# Patient Record
Sex: Female | Born: 1975 | Race: Black or African American | Hispanic: No | Marital: Married | Smoking: Never smoker
Health system: Southern US, Community
[De-identification: ages and names within clinical notes are randomized; demographics above are authoritative.]

## PROBLEM LIST (undated history)

## (undated) DIAGNOSIS — D649 Anemia, unspecified: Secondary | ICD-10-CM

## (undated) DIAGNOSIS — R002 Palpitations: Secondary | ICD-10-CM

## (undated) DIAGNOSIS — I341 Nonrheumatic mitral (valve) prolapse: Secondary | ICD-10-CM

---

## 2019-02-12 DIAGNOSIS — I341 Nonrheumatic mitral (valve) prolapse: Secondary | ICD-10-CM | POA: Insufficient documentation

## 2019-09-18 HISTORY — PX: LAPAROSCOPIC HYSTERECTOMY: SHX1926

## 2021-01-10 ENCOUNTER — Other Ambulatory Visit: Payer: Self-pay | Admitting: Internal Medicine

## 2021-01-10 DIAGNOSIS — E041 Nontoxic single thyroid nodule: Secondary | ICD-10-CM

## 2021-01-12 ENCOUNTER — Other Ambulatory Visit: Payer: Self-pay | Admitting: Internal Medicine

## 2021-01-12 DIAGNOSIS — Z1231 Encounter for screening mammogram for malignant neoplasm of breast: Secondary | ICD-10-CM

## 2021-02-08 ENCOUNTER — Ambulatory Visit
Admission: RE | Admit: 2021-02-08 | Discharge: 2021-02-08 | Disposition: A | Discharge status: Home / Self Care | Payer: Managed Care, Other (non HMO) | Source: Ambulatory Visit | Attending: Internal Medicine | Admitting: Internal Medicine

## 2021-02-08 ENCOUNTER — Other Ambulatory Visit: Payer: Self-pay

## 2021-02-08 DIAGNOSIS — Z1231 Encounter for screening mammogram for malignant neoplasm of breast: Secondary | ICD-10-CM

## 2021-02-08 DIAGNOSIS — E041 Nontoxic single thyroid nodule: Secondary | ICD-10-CM

## 2021-08-21 ENCOUNTER — Emergency Department (HOSPITAL_BASED_OUTPATIENT_CLINIC_OR_DEPARTMENT_OTHER): Payer: Managed Care, Other (non HMO)

## 2021-08-21 ENCOUNTER — Emergency Department (HOSPITAL_BASED_OUTPATIENT_CLINIC_OR_DEPARTMENT_OTHER)
Admission: EM | Admit: 2021-08-21 | Discharge: 2021-08-21 | Disposition: A | Discharge status: Home / Self Care | Payer: Managed Care, Other (non HMO) | Attending: Emergency Medicine | Admitting: Emergency Medicine

## 2021-08-21 ENCOUNTER — Other Ambulatory Visit: Payer: Self-pay

## 2021-08-21 ENCOUNTER — Emergency Department (HOSPITAL_BASED_OUTPATIENT_CLINIC_OR_DEPARTMENT_OTHER): Payer: Managed Care, Other (non HMO) | Admitting: Radiology

## 2021-08-21 ENCOUNTER — Encounter (HOSPITAL_BASED_OUTPATIENT_CLINIC_OR_DEPARTMENT_OTHER): Payer: Self-pay | Admitting: Emergency Medicine

## 2021-08-21 DIAGNOSIS — R079 Chest pain, unspecified: Secondary | ICD-10-CM

## 2021-08-21 DIAGNOSIS — R0602 Shortness of breath: Secondary | ICD-10-CM | POA: Diagnosis not present

## 2021-08-21 DIAGNOSIS — R072 Precordial pain: Secondary | ICD-10-CM | POA: Insufficient documentation

## 2021-08-21 HISTORY — DX: Palpitations: R00.2

## 2021-08-21 HISTORY — DX: Anemia, unspecified: D64.9

## 2021-08-21 HISTORY — DX: Nonrheumatic mitral (valve) prolapse: I34.1

## 2021-08-21 LAB — CBC
HCT: 39.2 % (ref 36.0–46.0)
Hemoglobin: 12.9 g/dL (ref 12.0–15.0)
MCH: 31.4 pg (ref 26.0–34.0)
MCHC: 32.9 g/dL (ref 30.0–36.0)
MCV: 95.4 fL (ref 80.0–100.0)
Platelets: 207 10*3/uL (ref 150–400)
RBC: 4.11 MIL/uL (ref 3.87–5.11)
RDW: 13.2 % (ref 11.5–15.5)
WBC: 4.6 10*3/uL (ref 4.0–10.5)
nRBC: 0 % (ref 0.0–0.2)

## 2021-08-21 LAB — BASIC METABOLIC PANEL
Anion gap: 8 (ref 5–15)
BUN: 8 mg/dL (ref 6–20)
CO2: 25 mmol/L (ref 22–32)
Calcium: 9 mg/dL (ref 8.9–10.3)
Chloride: 105 mmol/L (ref 98–111)
Creatinine, Ser: 0.7 mg/dL (ref 0.44–1.00)
GFR, Estimated: >60 mL/min (ref >60)
Glucose, Bld: 118 mg/dL — ABNORMAL HIGH (ref 70–99)
Potassium: 3.6 mmol/L (ref 3.5–5.1)
Sodium: 138 mmol/L (ref 135–145)

## 2021-08-21 LAB — TROPONIN I (HIGH SENSITIVITY)
Troponin I (High Sensitivity): <2 ng/L (ref <18)
Troponin I (High Sensitivity): 2 ng/L (ref <18)

## 2021-08-21 MED ORDER — FAMOTIDINE IN NACL 20-0.9 MG/50ML-% IV SOLN
20.0000 mg | Freq: Once | INTRAVENOUS | Status: AC
  Administered 2021-08-21: 20 mg via INTRAVENOUS
  Filled 2021-08-21: qty 50

## 2021-08-21 MED ORDER — ASPIRIN 81 MG PO CHEW
324.0000 mg | CHEWABLE_TABLET | Freq: Once | ORAL | Status: AC
  Administered 2021-08-21: 324 mg via ORAL
  Filled 2021-08-21: qty 4

## 2021-08-21 MED ORDER — KETOROLAC TROMETHAMINE 15 MG/ML IJ SOLN
15.0000 mg | Freq: Once | INTRAMUSCULAR | Status: AC
  Administered 2021-08-21: 15 mg via INTRAVENOUS
  Filled 2021-08-21: qty 1

## 2021-08-21 MED ORDER — ALUM & MAG HYDROXIDE-SIMETH 200-200-20 MG/5ML PO SUSP
30.0000 mL | Freq: Once | ORAL | Status: AC
  Administered 2021-08-21: 30 mL via ORAL
  Filled 2021-08-21: qty 30

## 2021-08-21 MED ORDER — IOHEXOL 350 MG/ML SOLN
75.0000 mL | Freq: Once | INTRAVENOUS | Status: AC | PRN
  Administered 2021-08-21: 75 mL via INTRAVENOUS

## 2021-08-21 MED ORDER — LIDOCAINE VISCOUS HCL 2 % MT SOLN
15.0000 mL | Freq: Once | OROMUCOSAL | Status: AC
  Administered 2021-08-21: 15 mL via ORAL
  Filled 2021-08-21: qty 15

## 2021-08-21 NOTE — ED Provider Notes (Signed)
[facility] EMERGENCY DEPT Provider Note   CSN: [id number] Arrival date & time: 08/21/21  1205     History Chief Complaint  Patient presents with   Chest Pain    Haley Welch is a 45 y.o. female.   Chest Pain Pain location:  Substernal area Pain quality: pressure   Pain radiates to:  Does not radiate Pain severity:  Moderate Onset quality:  Gradual Duration:  2 weeks Timing:  Intermittent Progression:  Worsening Chronicity:  New Context: breathing   Relieved by:  Nothing Worsened by:  Deep breathing Ineffective treatments:  None tried Associated symptoms: shortness of breath   Associated symptoms: no abdominal pain, no back pain, no cough, no fatigue, no fever, no headache, no nausea, no near-syncope, no palpitations, no vomiting and no weakness   Risk factors comment:  Hx of MVP Patient presents for chest pain.  This started 2 weeks ago but worsened today.  She also endorses shortness of breath. HPI: A 45 year old patient presents for evaluation of chest pain. Initial onset of pain was less than one hour ago. The patient's chest pain is described as heaviness/pressure/tightness and is not worse with exertion. The patient's chest pain is middle- or left-sided, is not well-localized, is not sharp and does not radiate to the arms/jaw/neck. The patient does not complain of nausea and denies diaphoresis. The patient has a family history of coronary artery disease in a first-degree relative with onset less than age 8. The patient has no history of stroke, has no history of peripheral artery disease, has not smoked in the past 90 days, denies any history of treated diabetes, is not hypertensive, has no history of hypercholesterolemia and does not have an elevated BMI (>=30).   Past Medical History:  Diagnosis Date   Anemia    Mitral valve prolapse    Palpitations     There are no problems to display for this patient.   History reviewed. No pertinent  surgical history.   OB History   No obstetric history on file.     History reviewed. No pertinent family history.  Social History   Tobacco Use   Smoking status: Never   Smokeless tobacco: Never    Home Medications Prior to Admission medications   Not on File    Allergies    Patient has no known allergies.  Review of Systems   Review of Systems  Constitutional:  Negative for chills, fatigue and fever.  HENT:  Negative for ear pain and sore throat.   Eyes:  Negative for pain and visual disturbance.  Respiratory:  Positive for chest tightness and shortness of breath. Negative for cough and wheezing.   Cardiovascular:  Positive for chest pain. Negative for palpitations and near-syncope.  Gastrointestinal:  Negative for abdominal pain, diarrhea, nausea and vomiting.  Genitourinary:  Negative for dysuria, flank pain, hematuria and pelvic pain.  Musculoskeletal:  Negative for arthralgias and back pain.  Skin:  Negative for color change and rash.  Neurological:  Negative for seizures, syncope, weakness and headaches.  All other systems reviewed and are negative.  Physical Exam Updated Vital Signs BP 128/80 (BP Location: Right Arm)   Pulse 72   Temp 98.7 F (37.1 C) (Oral)   Resp 15   Ht 5' 7.5" (1.715 m)   Wt 72.6 kg   SpO2 100%   BMI 24.69 kg/m   Physical Exam Vitals and nursing note reviewed.  Constitutional:      General: She is not in acute  distress.    Appearance: She is well-developed and normal weight. She is not ill-appearing, toxic-appearing or diaphoretic.  HENT:     Head: Normocephalic and atraumatic.  Eyes:     Extraocular Movements: Extraocular movements intact.     Conjunctiva/sclera: Conjunctivae normal.  Neck:     Vascular: No JVD.  Cardiovascular:     Rate and Rhythm: Normal rate and regular rhythm.     Heart sounds: No murmur heard. Pulmonary:     Effort: Pulmonary effort is normal. No respiratory distress.     Breath sounds: Normal  breath sounds. No decreased breath sounds, wheezing, rhonchi or rales.  Chest:     Chest wall: Tenderness present.  Abdominal:     Palpations: Abdomen is soft.     Tenderness: There is no abdominal tenderness.  Musculoskeletal:        General: No swelling.     Cervical back: Normal range of motion and neck supple.     Right lower leg: No edema.     Left lower leg: No edema.  Skin:    General: Skin is warm and dry.  Neurological:     General: No focal deficit present.     Mental Status: She is alert and oriented to person, place, and time.     Cranial Nerves: No cranial nerve deficit.     Motor: No weakness.  Psychiatric:        Mood and Affect: Mood normal.        Behavior: Behavior normal.    ED Results / Procedures / Treatments   Labs (all labs ordered are listed, but only abnormal results are displayed) Labs Reviewed  BASIC METABOLIC PANEL - Abnormal; Notable for the following components:      Result Value   Glucose, Bld 118 (*)    All other components within normal limits  CBC  TROPONIN I (HIGH SENSITIVITY)  TROPONIN I (HIGH SENSITIVITY)    EKG EKG Interpretation  Date/Time:  Monday August 21 2021 12:21:53 EST Ventricular Rate:  79 PR Interval:  126 QRS Duration: 84 QT Interval:  392 QTC Calculation: 449 R Axis:   66 Text Interpretation: Normal sinus rhythm Nonspecific T wave abnormality Abnormal ECG Confirmed by Godfrey Pick ([id number]) on 08/21/2021 1:15:09 PM  Radiology DG Chest 2 View  Result Date: 08/21/2021 CLINICAL DATA:  Chest pain EXAM: CHEST - 2 VIEW COMPARISON:  None. FINDINGS: Cardiomediastinal silhouette is normal. The lungs are clear, with no focal consolidation or pulmonary edema. There he is no pleural effusion or pneumothorax. There is no acute osseous abnormality. IMPRESSION: No radiographic evidence of acute cardiopulmonary process. Electronically Signed   By: Valetta Mole M.D.   On: 08/21/2021 12:49   CT Angio Chest PE W and/or Wo  Contrast  Result Date: 08/21/2021 CLINICAL DATA:  PE suspected, high prob EXAM: CT ANGIOGRAPHY CHEST WITH CONTRAST TECHNIQUE: Multidetector CT imaging of the chest was performed using the standard protocol during bolus administration of intravenous contrast. Multiplanar CT image reconstructions and MIPs were obtained to evaluate the vascular anatomy. CONTRAST:  22mL OMNIPAQUE IOHEXOL 350 MG/ML SOLN COMPARISON:  None. FINDINGS: Cardiovascular: Satisfactory opacification of the pulmonary arteries to the segmental level. No evidence of pulmonary embolism. Normal heart size. No pericardial effusion. Mediastinum/Nodes: No enlarged nodes. Thyroid and esophagus are unremarkable. Lungs/Pleura: No consolidation or mass. No pleural effusion or pneumothorax. Upper Abdomen: No acute abnormality. Musculoskeletal: No acute osseous abnormality. Review of the MIP images confirms the above findings. IMPRESSION: No evidence of  acute pulmonary embolism or other acute abnormality. Electronically Signed   By: Macy Mis M.D.   On: 08/21/2021 15:02    Procedures Procedures   Medications Ordered in ED Medications  aspirin chewable tablet 324 mg (324 mg Oral Given 08/21/21 1420)  iohexol (OMNIPAQUE) 350 MG/ML injection 75 mL (75 mLs Intravenous Contrast Given 08/21/21 1439)  alum & mag hydroxide-simeth (MAALOX/MYLANTA) 200-200-20 MG/5ML suspension 30 mL (30 mLs Oral Given 08/21/21 1555)    And  lidocaine (XYLOCAINE) 2 % viscous mouth solution 15 mL (15 mLs Oral Given 08/21/21 1556)  famotidine (PEPCID) IVPB 20 mg premix (20 mg Intravenous New Bag/Given 08/21/21 1558)  ketorolac (TORADOL) 15 MG/ML injection 15 mg (15 mg Intravenous Given 08/21/21 1554)    ED Course  I have reviewed the triage vital signs and the nursing notes.  Pertinent labs & imaging results that were available during my care of the patient were reviewed by me and considered in my medical decision making (see chart for details).    MDM  Rules/Calculators/A&P HEAR Score: 4                        Patient presents for chest pressure.  This has been present over the past 2 weeks.  She feels that it got worse today.  Currently, she describes it as 5/10 in severity.  EKG shows nonspecific T wave changes, without evidence of ischemia.  Heart score is 4.  Patient was given 324 of ASA.  Lab work, including troponins was ordered.  Patient underwent a CTA of her chest.  Work-up was reassuring.  GI cocktail and Toradol were given for symptomatic relief.  She has a history of MVP and has seen cardiology in the past.  She has been lost to follow-up for the past several years.  She was advised to reestablish care with cardiology and to follow-up for reassessment and possible further outpatient testing.  She is also encouraged to return to ED for any worsening of symptoms.  She was discharged in stable condition.  Final Clinical Impression(s) / ED Diagnoses Final diagnoses:  Chest pain, unspecified type    Rx / DC Orders ED Discharge Orders     None        Godfrey Pick, MD 08/22/21 [phone]

## 2021-08-21 NOTE — ED Notes (Signed)
Patient transported to CT 

## 2021-08-21 NOTE — ED Triage Notes (Signed)
Pt arrives to ED with c/o centralized chest pain. This started two weeks ago, however worsened today. Pt reports that her chest is tender to the touch. The pain is intermittent until it became constant today. The pain is described as soreness/pressure. Associated symptoms include unable to take deep breaths, pt feels lungs are "restricted." Pt denies lightheadedness, N/V.

## 2021-10-15 ENCOUNTER — Other Ambulatory Visit: Payer: Self-pay

## 2021-10-15 ENCOUNTER — Emergency Department (HOSPITAL_BASED_OUTPATIENT_CLINIC_OR_DEPARTMENT_OTHER): Payer: Managed Care, Other (non HMO)

## 2021-10-15 ENCOUNTER — Emergency Department (HOSPITAL_BASED_OUTPATIENT_CLINIC_OR_DEPARTMENT_OTHER)
Admission: EM | Admit: 2021-10-15 | Discharge: 2021-10-15 | Disposition: A | Discharge status: Home / Self Care | Payer: Managed Care, Other (non HMO) | Attending: Emergency Medicine | Admitting: Emergency Medicine

## 2021-10-15 DIAGNOSIS — Y9301 Activity, walking, marching and hiking: Secondary | ICD-10-CM | POA: Insufficient documentation

## 2021-10-15 DIAGNOSIS — Z79899 Other long term (current) drug therapy: Secondary | ICD-10-CM | POA: Diagnosis not present

## 2021-10-15 DIAGNOSIS — S96911A Strain of unspecified muscle and tendon at ankle and foot level, right foot, initial encounter: Secondary | ICD-10-CM | POA: Diagnosis not present

## 2021-10-15 DIAGNOSIS — W010XXA Fall on same level from slipping, tripping and stumbling without subsequent striking against object, initial encounter: Secondary | ICD-10-CM | POA: Diagnosis not present

## 2021-10-15 DIAGNOSIS — S99911A Unspecified injury of right ankle, initial encounter: Secondary | ICD-10-CM | POA: Diagnosis present

## 2021-10-15 MED ORDER — IBUPROFEN 800 MG PO TABS
800.0000 mg | ORAL_TABLET | Freq: Once | ORAL | Status: AC
  Administered 2021-10-15: 800 mg via ORAL
  Filled 2021-10-15: qty 1

## 2021-10-15 NOTE — ED Notes (Signed)
Pt verbalizes understanding of discharge instructions. Opportunity for questioning and answers were provided. Pt discharged from ED to home.   ? ?

## 2021-10-15 NOTE — ED Triage Notes (Signed)
Pt was walking in heels this afternoon when she slipped and fell and twisted right ankle. Unable to apply weight on right foot but was able to drive self

## 2021-10-15 NOTE — Discharge Instructions (Signed)
Your x-ray today was negative for acute fracture.  You likely sustained a sprain of that ankle.  It given you a boot and some crutches to help with pain while you walk.  I have attached some information here about RICE therapy which is essentially to rest, elevate the leg on a few pillows when you are sitting or lying down.  Please ice for 15 to 20 minutes at a time over the next 24 to 48 hours.  You can use Tylenol and Motrin as needed for pain.  Your symptoms should resolve over the next couple of weeks.  Follow-up with your PCP as needed.

## 2021-10-15 NOTE — ED Provider Notes (Signed)
[facility] EMERGENCY DEPT Provider Note   CSN: [id number] Arrival date & time: 10/15/21  1722     History  Chief Complaint  Patient presents with   Ankle Pain    Haley Welch is a 46 y.o. female who presents to the ED for evaluation of a right ankle injury that occurred earlier today.  Patient was at a restaurant and walking on a cobblestone street with high heels when she rolled her ankle, inverting it and falling down.  She is able to get up and walk order received and did not notice much pain at first.  Over time, she developed swelling, pain and was having trouble walking.  No treatment prior to arrival.  Pain is aggravated when pressing on the proximal lateral aspect of the foot where it meets the ankle.  Patient has no other complaints.  She denies numbness and tingling.   Ankle Pain Associated symptoms: no fever       Home Medications Prior to Admission medications   Not on File      Allergies    Patient has no known allergies.    Review of Systems   Review of Systems  Constitutional:  Negative for fever.  HENT: Negative.    Eyes: Negative.   Respiratory:  Negative for shortness of breath.   Cardiovascular: Negative.   Gastrointestinal:  Negative for abdominal pain and vomiting.  Endocrine: Negative.   Genitourinary: Negative.   Musculoskeletal:  Positive for joint swelling.  Skin:  Negative for rash.  Neurological:  Negative for headaches.  All other systems reviewed and are negative.  Physical Exam Updated Vital Signs BP (!) 132/91 (BP Location: Left Arm)    Pulse 83    Temp 98.3 F (36.8 C) (Oral)    Resp 18    Ht 5\' 7"  (1.702 m)    Wt 72.6 kg    SpO2 100%    BMI 25.06 kg/m  Physical Exam Vitals and nursing note reviewed.  Constitutional:      General: She is not in acute distress.    Appearance: She is not ill-appearing.  HENT:     Head: Atraumatic.  Eyes:     Conjunctiva/sclera: Conjunctivae normal.  Cardiovascular:      Rate and Rhythm: Normal rate and regular rhythm.     Pulses: Normal pulses.     Heart sounds: No murmur heard. Pulmonary:     Effort: Pulmonary effort is normal. No respiratory distress.     Breath sounds: Normal breath sounds.  Abdominal:     General: Abdomen is flat. There is no distension.     Palpations: Abdomen is soft.     Tenderness: There is no abdominal tenderness.  Musculoskeletal:        General: Normal range of motion.     Cervical back: Normal range of motion.     Comments: Right ankle with mild swelling.  Tenderness to palpation at the ATL.  2+ DP pulses bilaterally.  Sensation intact.  Patient ambulatory on arrival.  Skin:    General: Skin is warm and dry.     Capillary Refill: Capillary refill takes less than 2 seconds.  Neurological:     General: No focal deficit present.     Mental Status: She is alert.  Psychiatric:        Mood and Affect: Mood normal.    ED Results / Procedures / Treatments   Labs (all labs ordered are listed, but only abnormal results are displayed) Labs  Reviewed - No data to display  EKG None  Radiology DG Ankle Complete Right  Result Date: 10/15/2021 CLINICAL DATA:  Twisted ankle. EXAM: RIGHT ANKLE - COMPLETE 3+ VIEW COMPARISON:  None. FINDINGS: There is no evidence of fracture, dislocation, or joint effusion. There is no evidence of arthropathy or other focal bone abnormality. Soft tissues are unremarkable. IMPRESSION: Negative. Electronically Signed   By: Ronney Asters M.D.   On: 10/15/2021 18:33    Procedures Procedures    Medications Ordered in ED Medications  ibuprofen (ADVIL) tablet 800 mg (800 mg Oral Given 10/15/21 1849)    ED Course/ Medical Decision Making/ A&P                           Medical Decision Making Amount and/or Complexity of Data Reviewed Radiology: ordered.  Risk Prescription drug management.   History:  Haley Welch is a 46 y.o. female who presents to the ED for evaluation of a right ankle  injury that occurred earlier today.  Patient was at a restaurant and walking on a cobblestone street with high heels when she rolled her ankle, inverting it and falling down.  She is able to get up and walk order received and did not notice much pain at first.  Over time, she developed swelling, pain and was having trouble walking.  No treatment prior to arrival.  Pain is aggravated when pressing on the proximal lateral aspect of the foot where it meets the ankle.  Patient has no other complaints.  She denies numbness and tingling. This patient presents to the ED for concern of ankle injury, this involves an extensive number of treatment options, and is a complaint that carries with it a high risk of complications and morbidity.   Differentials include fracture, sprain, tendon rupture  Initial impression:  Patient resting comfortably on bed, no acute distress nontoxic-appearing.  Physical exam was reassuring, patient is ambulatory although with difficulty due to pain.  Swelling is mild.  This is likely a strain, however we will wait for ankle x-ray prior to discharge.   Imaging Studies ordered:  I ordered imaging studies including right ankle x-ray which was negative for acute traumatic injury I independently visualized and interpreted imaging and I agree with the radiologist interpretation.   Medicines ordered and prescription drug management:  I ordered medication including: Ibuprofen 800 mg for pain   Reevaluation of the patient after these medicines showed that the patient improved I have reviewed the patients home medicines and have made adjustments as needed   Disposition:  After consideration of the diagnostic results, physical exam, history and the patients response to treatment feel that the patent would benefit from discharge with outpatient follow-up.   Right ankle sprain: X-ray and exam reassuring.  Cam boot and crutches were provided at patient request.  This is reasonable given  her pain during ambulation.  She had improvement with ibuprofen.  RICE protocol indicated.  She is to follow-up with her PCP as needed.  All questions asked and answered.  Discharged home in good condition.   Final Clinical Impression(s) / ED Diagnoses Final diagnoses:  Strain of right ankle, initial encounter    Rx / DC Orders ED Discharge Orders     None         Tonye Pearson, PA-C 10/15/21 1903    Fredia Sorrow, MD 10/15/21 2330

## 2022-01-04 ENCOUNTER — Ambulatory Visit (INDEPENDENT_AMBULATORY_CARE_PROVIDER_SITE_OTHER): Payer: Managed Care, Other (non HMO) | Admitting: Nurse Practitioner

## 2022-01-04 ENCOUNTER — Encounter (HOSPITAL_BASED_OUTPATIENT_CLINIC_OR_DEPARTMENT_OTHER): Payer: Self-pay | Admitting: Nurse Practitioner

## 2022-01-04 VITALS — BP 123/89 | HR 89 | Temp 98.7°F | Ht 67.0 in | Wt 159.0 lb

## 2022-01-04 DIAGNOSIS — D229 Melanocytic nevi, unspecified: Secondary | ICD-10-CM

## 2022-01-04 DIAGNOSIS — Z1211 Encounter for screening for malignant neoplasm of colon: Secondary | ICD-10-CM | POA: Diagnosis not present

## 2022-01-04 DIAGNOSIS — Z1231 Encounter for screening mammogram for malignant neoplasm of breast: Secondary | ICD-10-CM | POA: Diagnosis not present

## 2022-01-04 DIAGNOSIS — L910 Hypertrophic scar: Secondary | ICD-10-CM

## 2022-01-04 DIAGNOSIS — E042 Nontoxic multinodular goiter: Secondary | ICD-10-CM

## 2022-01-04 DIAGNOSIS — H9313 Tinnitus, bilateral: Secondary | ICD-10-CM

## 2022-01-04 DIAGNOSIS — E041 Nontoxic single thyroid nodule: Secondary | ICD-10-CM

## 2022-01-04 DIAGNOSIS — Z90710 Acquired absence of both cervix and uterus: Secondary | ICD-10-CM

## 2022-01-04 HISTORY — DX: Nontoxic single thyroid nodule: E04.1

## 2022-01-04 NOTE — Progress Notes (Signed)
? ?New Patient Office Visit ? ?Subjective   ? ?Patient ID: Haley Welch, female    DOB: 17-Jun-1976  Age: 46 y.o. MRN: [id number] ? ?CC:  ?Chief Complaint  ?Patient presents with  ? Establish Care  ? ? ?HPI ?Haley Welch presents to establish care ?She move to [place] from the [place] area about 2 years ago.  ?She endorses a history of hysterectomy 2 years ago due to heavy menses requiring iron infusions and blood transfusions. Her ovaries are intact, but her cervix was removed. She no longer requires pap screenings.  ? ?Tinnitus ?Chronic in nature. - etiology unknown ?Has seen ENT in the past and was referred to tinnitus clinic but didn't go due to move ?Primarily in the right ear, but now feels this is worsening ?Tinnitus clinic referral desired today for further evaluation.  ? ?Keloid and Nevi ?On umbilicus from her hysterectomy  ?Saw dermatology and they recommended leaving this  ?She feels it is growing and would like further evaluation and management ?Multiple nevi present on body that she would also like evaluated.  ? ?Thyroid Nodule ?Had an ultrasound, believes the results were inconclusive ?Once records reviewed would like to follow-up on this ? ?No outpatient encounter medications on file as of 01/04/2022.  ? ?No facility-administered encounter medications on file as of 01/04/2022.  ? ? ?Past Medical History:  ?Diagnosis Date  ? Anemia   ? Mitral valve prolapse   ? Palpitations   ? Thyroid nodule 01/04/2022  ? ? ?History reviewed. No pertinent surgical history. ? ?History reviewed. No pertinent family history. ? ?Social History  ? ?Socioeconomic History  ? Marital status: Married  ?  Spouse name: Not on file  ? Number of children: Not on file  ? Years of education: Not on file  ? Highest education level: Not on file  ?Occupational History  ? Not on file  ?Tobacco Use  ? Smoking status: Never  ? Smokeless tobacco: Never  ?Substance and Sexual Activity  ? Alcohol use: Not Currently  ? Drug use: Never  ? Sexual  activity: Yes  ?  Birth control/protection: None  ?Other Topics Concern  ? Not on file  ?Social History Narrative  ? Not on file  ? ?Social Determinants of Health  ? ?Financial Resource Strain: Not on file  ?Food Insecurity: Not on file  ?Transportation Needs: Not on file  ?Physical Activity: Not on file  ?Stress: Not on file  ?Social Connections: Not on file  ?Intimate Partner Violence: Not on file  ? ? ?ROS ?All review of systems negative except what is listed in the HPI ? ?Objective   ? ?BP 123/89   Pulse 89   Temp 98.7 ?F (37.1 ?C)   Ht '5\' 7"'$  (1.702 m)   Wt 159 lb (72.1 kg)   SpO2 99%   BMI 24.90 kg/m?  ? ?Physical Exam ?Vitals and nursing note reviewed.  ?Constitutional:   ?   Appearance: Normal appearance. She is normal weight.  ?HENT:  ?   Head: Normocephalic.  ?Eyes:  ?   Extraocular Movements: Extraocular movements intact.  ?   Conjunctiva/sclera: Conjunctivae normal.  ?   Pupils: Pupils are equal, round, and reactive to light.  ?Neck:  ?   Vascular: No carotid bruit.  ?Cardiovascular:  ?   Rate and Rhythm: Normal rate and regular rhythm.  ?   Pulses: Normal pulses.  ?   Heart sounds: Normal heart sounds.  ?Pulmonary:  ?   Effort: Pulmonary  effort is normal.  ?   Breath sounds: Normal breath sounds.  ?Abdominal:  ?   General: Abdomen is flat. Bowel sounds are normal.  ?   Palpations: Abdomen is soft.  ?Musculoskeletal:     ?   General: Normal range of motion.  ?   Cervical back: Normal range of motion.  ?   Right lower leg: No edema.  ?   Left lower leg: No edema.  ?Lymphadenopathy:  ?   Cervical: No cervical adenopathy.  ?Skin: ?   General: Skin is warm and dry.  ?   Capillary Refill: Capillary refill takes less than 2 seconds.  ? ?    ?Neurological:  ?   General: No focal deficit present.  ?   Mental Status: She is alert and oriented to person, place, and time.  ?Psychiatric:     ?   Mood and Affect: Mood normal.     ?   Behavior: Behavior normal.     ?   Thought Content: Thought content normal.      ?   Judgment: Judgment normal.  ? ? ? ?Assessment & Plan:  ? ?Problem List Items Addressed This Visit   ? ? Thyroid nodule  ?  Chronic. Previous evaluation performed. No records to review at this time. No nodularity detected on exam today. Will plan to repeat thyroid US for further evaluation.  ? ?  ?  ? Relevant Orders  ? US THYROID  ? Keloid skin disorder  ?  Appx 3cm keloid to umbilicus following hysterectomy surgery 2 years ago. Patient reports this is growing in size. Recommend evaluation with dermatology for recommendations for treatment. No alarm sx present at this time, but given size, I do feel that management is needed.  ? ?  ?  ? Relevant Orders  ? Ambulatory referral to Dermatology  ? Multiple nevi  ?  Referral to dermatology today.  ? ?  ?  ? Relevant Orders  ? Ambulatory referral to Dermatology  ? Tinnitus of both ears  ?  Chronic. Worsening with R>L. Will send referral today to ENT for further evaluation and recommendations on monitoring and options for control/management.  ? ?  ?  ? Relevant Orders  ? Ambulatory referral to ENT  ? History of hysterectomy including cervix  ?  Pap screening no longer required. Will removed from HM list.  ? ?  ?  ? ?Other Visit Diagnoses   ? ? Screening mammogram for breast cancer    -  Primary  ? Relevant Orders  ? MM Digital Screening  ? Screening for colon cancer      ? Relevant Orders  ? Ambulatory referral to Gastroenterology  ? ?  ? ? ?Return for in future for CPE as schedule permits.  ? ?Orma Render, NP ? ? ?

## 2022-01-04 NOTE — Patient Instructions (Addendum)
Thank you for choosing [place] [facility] for your Primary Care needs. I am excited for the opportunity to partner with you to meet your health care goals. It was a pleasure meeting you today! ? ?Recommendations from today's visit: ?I have placed a referral for colonoscopy for you today ?I have placed a referral to dermatology for you ?I will look up the tinnitus clinic to send a referral for you to be seen about the ear ringing.  ?I have placed an order for the mammogram, they will call you to schedule in May ?I have placed the order for the repeat ultrasound of the thyroid- we will plan to have this done in May ? ?Information on diet, exercise, and health maintenance recommendations are listed below. This is information to help you be sure you are on track for optimal health and monitoring.  ? ?Please look over this and let Haley Welch know if you have any questions or if you have completed any of the health maintenance outside of [place] so that we can be sure your records are up to date.  ?___________________________________________________________ ?About Me: ?I am an Adult-Geriatric Nurse Practitioner with a background in caring for patients for more than 20 years with a strong intensive care background. I provide primary care and sports medicine services to patients age 79 and older within this office. My education had a strong focus on caring for the older adult population, which I am passionate about. I am also the director of the APP Fellowship with [facility].  ? ?My desire is to provide you with the best service through preventive medicine and supportive care. I consider you a part of the medical team and value your input. I work diligently to ensure that you are heard and your needs are met in a safe and effective manner. I want you to feel comfortable with me as your provider and want you to know that your health concerns are important to me. ? ?For your information, our office  hours are: ?Monday, Tuesday, and Thursday 8:00 AM - 5:00 PM ?Wednesday and Friday 8:00 AM - 12:00 PM.  ? ?In my time away from the office I am teaching new APP's within the system and am unavailable, but my partner, Dr. Burnard Bunting is in the office for emergent needs.  ? ?If you have questions or concerns, please call our office at [phone] or send Haley Welch a MyChart message and we will respond as quickly as possible.  ?____________________________________________________________ ?MyChart:  ?For all urgent or time sensitive needs we ask that you please call the office to avoid delays. Our number is [phone]. ?MyChart is not constantly monitored and due to the large volume of messages a day, replies may take up to 72 business hours. ? ?MyChart Policy: ?MyChart allows for you to see your visit notes, after visit summary, provider recommendations, lab and tests results, make an appointment, request refills, and contact your provider or the office for non-urgent questions or concerns. Providers are seeing patients during normal business hours and do not have built in time to review MyChart messages.  ?We ask that you allow a minimum of 3 business days for responses to [employer]. For this reason, please do not send urgent requests through [place]. Please call the office at [phone]. ?New and ongoing conditions may require a visit. We have virtual and in person visit available for your convenience.  ?Complex MyChart concerns may require a visit. Your provider may request you schedule  a virtual or in person visit to ensure we are providing the best care possible. ?MyChart messages sent after 11:00 AM on Friday will not be received by the provider until Monday morning.  ?  ?Lab and Test Results: ?You will receive your lab and test results on MyChart as soon as they are completed and results have been sent by the lab or testing facility. Due to this service, you will receive your results BEFORE your provider.  ?I  review lab and tests results each morning prior to seeing patients. Some results require collaboration with other providers to ensure you are receiving the most appropriate care. For this reason, we ask that you please allow a minimum of 3-5 business days from the time the ALL results have been received for your provider to receive and review lab and test results and contact you about these.  ?Most lab and test result comments from the provider will be sent through [place]. Your provider may recommend changes to the plan of care, follow-up visits, repeat testing, ask questions, or request an office visit to discuss these results. You may reply directly to this message or call the office at [phone] to provide information for the provider or set up an appointment. ?In some instances, you will be called with test results and recommendations. Please let Haley Welch know if this is preferred and we will make note of this in your chart to provide this for you.    ?If you have not heard a response to your lab or test results in 5 business days from all results returning to [place], please call the office to let Haley Welch know. We ask that you please avoid calling prior to this time unless there is an emergent concern. Due to high call volumes, this can delay the resulting process. ? ?After Hours: ?For all non-emergency after hours needs, please call the office at [phone] and select the option to reach the on-call provider service. On-call services are shared between multiple [place] offices and therefore it will not be possible to speak directly with your provider. On-call providers may provide medical advice and recommendations, but are unable to provide refills for maintenance medications.  ?For all emergency or urgent medical needs after normal business hours, we recommend that you seek care at the closest Urgent Care or Emergency Department to ensure appropriate treatment in a timely manner.  ?[facility] at  [place] has a 24 hour emergency room located on the ground floor for your convenience.  ? ?Urgent Concerns During the Business Day ?Providers are seeing patients from 8AM to [place] with a busy schedule and are most often not able to respond to non-urgent calls until the end of the day or the next business day. ?If you should have URGENT concerns during the day, please call and speak to the nurse or schedule a same day appointment so that we can address your concern without delay.  ? ?Thank you, again, for choosing me as your health care partner. I appreciate your trust and look forward to learning more about you.  ? ?Worthy Keeler, DNP, AGNP-c ?___________________________________________________________ ? ?Health Maintenance Recommendations ?Screening Testing ?Mammogram ?Every 1 -2 years based on history and risk factors ?Starting at age 44 ?Pap Smear ?Ages 21-39 every 3 years ?Ages 52-65 every 5 years with HPV testing ?More frequent testing may be required based on results and history ?Colon Cancer Screening ?Every 1-10 years based on test performed, risk factors, and history ?Starting at age 59 ?Bone Density  Screening ?Every 2-10 years based on history ?Starting at age 27 for women ?Recommendations for men differ based on medication usage, history, and risk factors ?AAA Screening ?One time ultrasound ?Men 96-59 years old who have every smoked ?Lung Cancer Screening ?Low Dose Lung CT every 12 months ?Age 41-80 years with a 30 pack-year smoking history who still smoke or who have quit within the last 15 years ? ?Screening Labs ?Routine  Labs: Complete Blood Count (CBC), Complete Metabolic Panel (CMP), Cholesterol (Lipid Panel) ?Every 6-12 months based on history and medications ?May be recommended more frequently based on current conditions or previous results ?Hemoglobin A1c Lab ?Every 3-12 months based on history and previous results ?Starting at age 32 or earlier with diagnosis of diabetes, high cholesterol, BMI  >26, and/or risk factors ?Frequent monitoring for patients with diabetes to ensure blood sugar control ?Thyroid Panel (TSH w/ T3 & T4) ?Every 6 months based on history, symptoms, and risk factors ?May be

## 2022-01-15 ENCOUNTER — Ambulatory Visit (HOSPITAL_BASED_OUTPATIENT_CLINIC_OR_DEPARTMENT_OTHER)
Admission: RE | Admit: 2022-01-15 | Discharge: 2022-01-15 | Disposition: A | Discharge status: Home / Self Care | Payer: Managed Care, Other (non HMO) | Source: Ambulatory Visit | Attending: Nurse Practitioner | Admitting: Nurse Practitioner

## 2022-01-15 DIAGNOSIS — E041 Nontoxic single thyroid nodule: Secondary | ICD-10-CM | POA: Diagnosis not present

## 2022-01-16 DIAGNOSIS — L910 Hypertrophic scar: Secondary | ICD-10-CM | POA: Insufficient documentation

## 2022-01-16 DIAGNOSIS — H9313 Tinnitus, bilateral: Secondary | ICD-10-CM | POA: Insufficient documentation

## 2022-01-16 DIAGNOSIS — Z90710 Acquired absence of both cervix and uterus: Secondary | ICD-10-CM | POA: Insufficient documentation

## 2022-01-16 DIAGNOSIS — D229 Melanocytic nevi, unspecified: Secondary | ICD-10-CM | POA: Insufficient documentation

## 2022-01-16 NOTE — Assessment & Plan Note (Signed)
Pap screening no longer required. Will removed from [facility] list.  ?

## 2022-01-16 NOTE — Assessment & Plan Note (Signed)
Appx 3cm keloid to umbilicus following hysterectomy surgery 2 years ago. Patient reports this is growing in size. Recommend evaluation with dermatology for recommendations for treatment. No alarm sx present at this time, but given size, I do feel that management is needed.  ?

## 2022-01-16 NOTE — Assessment & Plan Note (Signed)
Chronic. Worsening with R>L. Will send referral today to ENT for further evaluation and recommendations on monitoring and options for control/management.  ?

## 2022-01-16 NOTE — Assessment & Plan Note (Signed)
Referral to dermatology today.  ?

## 2022-01-16 NOTE — Assessment & Plan Note (Signed)
Chronic. Previous evaluation performed. No records to review at this time. No nodularity detected on exam today. Will plan to repeat thyroid US for further evaluation.  ?

## 2022-01-18 ENCOUNTER — Encounter (HOSPITAL_BASED_OUTPATIENT_CLINIC_OR_DEPARTMENT_OTHER): Payer: Self-pay | Admitting: Nurse Practitioner

## 2022-01-18 NOTE — Addendum Note (Signed)
Addended by: Antoinetta Berrones, Clarise Cruz E on: 01/18/2022 08:51 PM ? ? Modules accepted: Orders ? ?

## 2022-02-16 ENCOUNTER — Ambulatory Visit
Admission: RE | Admit: 2022-02-16 | Discharge: 2022-02-16 | Disposition: A | Discharge status: Home / Self Care | Payer: Managed Care, Other (non HMO) | Source: Ambulatory Visit | Attending: Nurse Practitioner | Admitting: Nurse Practitioner

## 2022-02-16 DIAGNOSIS — Z1231 Encounter for screening mammogram for malignant neoplasm of breast: Secondary | ICD-10-CM

## 2022-03-02 ENCOUNTER — Encounter (HOSPITAL_BASED_OUTPATIENT_CLINIC_OR_DEPARTMENT_OTHER): Payer: 59 | Admitting: Nurse Practitioner

## 2022-06-04 ENCOUNTER — Encounter (HOSPITAL_BASED_OUTPATIENT_CLINIC_OR_DEPARTMENT_OTHER): Payer: Self-pay | Admitting: Nurse Practitioner

## 2022-06-04 ENCOUNTER — Telehealth (INDEPENDENT_AMBULATORY_CARE_PROVIDER_SITE_OTHER): Payer: Managed Care, Other (non HMO) | Admitting: Nurse Practitioner

## 2022-06-04 DIAGNOSIS — U071 COVID-19: Secondary | ICD-10-CM

## 2022-06-04 DIAGNOSIS — E041 Nontoxic single thyroid nodule: Secondary | ICD-10-CM

## 2022-06-04 DIAGNOSIS — D229 Melanocytic nevi, unspecified: Secondary | ICD-10-CM

## 2022-06-04 DIAGNOSIS — Z1211 Encounter for screening for malignant neoplasm of colon: Secondary | ICD-10-CM

## 2022-06-04 DIAGNOSIS — L910 Hypertrophic scar: Secondary | ICD-10-CM | POA: Diagnosis not present

## 2022-06-04 MED ORDER — NIRMATRELVIR/RITONAVIR (PAXLOVID)TABLET: 3.0000 | ORAL_TABLET | Freq: Two times a day (BID) | ORAL | 0 refills | Status: AC

## 2022-06-04 NOTE — Progress Notes (Signed)
Virtual Visit Encounter telephone visit.   I connected with  Haley Welch on 06/16/22 at 10:50 AM EDT by secure audio telemedicine application. I verified that I am speaking with the correct person using two identifiers.   I introduced myself as a Designer, jewellery with the practice. The limitations of evaluation and management by telemedicine discussed with the patient and the availability of in person appointments. The patient expressed verbal understanding and consent to proceed.  Participating parties in this visit include: Myself and patient  The patient is: Patient Location: Home I am: Provider Location: Office/Clinic Subjective:    CC and HPI: Haley Welch is a 46 y.o. year old female presenting for new evaluation and treatment of COVID. Patient reports the following: COVID  Orabelle endorses symptoms starting last Thursday.  At that time she tested and she was negative.  She continued to have worsening symptoms and she tested again Sunday and was possitive She endorses congestion, fever, severe body aches, chills, fatigue. She denies shortness of breath, dizziness, or respiratory difficulty.  She would also like a referral to dermatology for keloids and a referral to gastroenterology for colon cancer screening.  Past medical history, Surgical history, Family history not pertinant except as noted below, Social history, Allergies, and medications have been entered into the medical record, reviewed, and corrections made.   Review of Systems:  All review of systems negative except what is listed in the HPI  Objective:    Alert and oriented x 4 Speaking in clear sentences with no shortness of breath. No distress.  Impression and Recommendations:    Problem List Items Addressed This Visit     Keloid skin disorder    Referral placed to dermatology.      Relevant Orders   Ambulatory referral to Dermatology   COVID-19 - Primary    Positive COVID-19 test with  symptoms.  Recommendations provided for over-the-counter treatment and monitoring for worsening symptoms and alarm symptoms that would warrant immediate evaluation.  Patient expresses understanding.  We will send treatment Paxlovid.  Follow-up if symptoms worsen or fail to improve.      Multiple nevi   Other Visit Diagnoses     Screening for colon cancer       Relevant Orders   Ambulatory referral to Gastroenterology       orders and follow up as documented in EMR I discussed the assessment and treatment plan with the patient. The patient was provided an opportunity to ask questions and all were answered. The patient agreed with the plan and demonstrated an understanding of the instructions.   The patient was advised to call back or seek an in-person evaluation if the symptoms worsen or if the condition fails to improve as anticipated.  Follow-Up: prn  I provided 15 minutes of non-face-to-face interaction with this non face-to-face encounter including intake, same-day documentation, and chart review.   Orma Render, NP , DNP, AGNP-c [place] at [facility] [phone] (fax)

## 2022-06-04 NOTE — Patient Instructions (Addendum)
I have sent in the Paxlovid to the pharmacy.      Over the counter medications that may be helpful for symptoms: Guaifenesin 1200 mg extended release tabs twice daily, with plenty of water For cough and congestion Brand name: Mucinex   Pseudoephedrine 30 mg, one or two tabs every 4 to 6 hours (one will be less drying) For sinus congestion Brand name: Sudafed You must get this from the pharmacy counter.  Oxymetazoline nasal spray each morning, one spray in each nostril, for NO MORE THAN 3 days  For nasal and sinus congestion Brand name: Afrin Saline nasal spray or Saline Nasal Irrigation 3-5 times a day For nasal and sinus congestion Brand names: Ocean or AYR Fluticasone nasal spray, one spray in each nostril, each morning after oxymetazoline and saline, if used For nasal and sinus congestion Brand name: Flonase Warm salt water gargles  For sore throat Every few hours as needed Alternate ibuprofen 400-600 mg and acetaminophen 1000 mg every 4-6 hours For fever, body aches, headache Brand names: Motrin or Advil and Tylenol Dextromethorphan 12-hour cough version 30 mg every 12 hours  For cough Brand name: Delsym Stop all other cold medications for now (Nyquil, Dayquil, Tylenol Cold, Theraflu, etc) and other non-prescription cough/cold preparations. Many of these have the same ingredients listed above and could cause an overdose of medication.   General Instructions Allow your body to rest Drink PLENTY of fluids Isolate yourself from everyone, even family, until test results have returned  If your COVID-19 test is positive Then you ARE INFECTED and you can pass the virus to others You must quarantine from others for a minimum of  10 days since symptoms started AND You are fever free for 24 hours WITHOUT any medication to reduce fever AND Your symptoms are improving Do not go to the store or other public areas Do not go around household members who are not known to be infected  with COVID-19 If you MUST leave you area of quarantine (example: go to a bathroom you share with others in your home), you must Wear a mask Wash your hands thoroughly Wipe down any surfaces you touch Do not share food, drinks, towels, or other items with other persons Dispose of your own tissues, food containers, etc  Once you have recovered, please continue good preventive care measures, including:  wearing a mask when in public wash your hands frequently avoid touching your face/nose/eyes cover coughs/sneezes with the inside of your elbow stay out of crowds keep a 6 foot distance from others  Go to the nearest hospital emergency room if fever/cough/breathlessness are severe or illness seems like a threat to life.

## 2022-06-05 ENCOUNTER — Encounter (HOSPITAL_BASED_OUTPATIENT_CLINIC_OR_DEPARTMENT_OTHER): Payer: Self-pay

## 2022-06-16 DIAGNOSIS — U071 COVID-19: Secondary | ICD-10-CM | POA: Insufficient documentation

## 2022-06-16 HISTORY — DX: COVID-19: U07.1

## 2022-06-16 NOTE — Assessment & Plan Note (Signed)
Referral placed to dermatology

## 2022-06-16 NOTE — Assessment & Plan Note (Signed)
Positive COVID-19 test with symptoms.  Recommendations provided for over-the-counter treatment and monitoring for worsening symptoms and alarm symptoms that would warrant immediate evaluation.  Patient expresses understanding.  We will send treatment Paxlovid.  Follow-up if symptoms worsen or fail to improve.

## 2022-10-31 ENCOUNTER — Encounter: Payer: Self-pay | Admitting: Gastroenterology

## 2022-12-07 ENCOUNTER — Other Ambulatory Visit: Payer: Self-pay

## 2022-12-07 ENCOUNTER — Ambulatory Visit (AMBULATORY_SURGERY_CENTER): Payer: Managed Care, Other (non HMO)

## 2022-12-07 VITALS — Ht 68.5 in | Wt 155.0 lb

## 2022-12-07 DIAGNOSIS — Z1211 Encounter for screening for malignant neoplasm of colon: Secondary | ICD-10-CM

## 2022-12-07 MED ORDER — NA SULFATE-K SULFATE-MG SULF 17.5-3.13-1.6 GM/177ML PO SOLN: 1.0000 | Freq: Once | ORAL | 0 refills | Status: AC

## 2022-12-07 NOTE — Progress Notes (Signed)
Denies allergies to eggs or soy products. Denies complication of anesthesia or sedation. Denies use of weight loss medication. Denies use of O2.   Emmi instructions given for colonoscopy.  

## 2022-12-13 ENCOUNTER — Encounter: Payer: Self-pay | Admitting: Gastroenterology

## 2023-01-02 ENCOUNTER — Telehealth: Payer: Self-pay | Admitting: Gastroenterology

## 2023-01-02 ENCOUNTER — Telehealth: Payer: Self-pay | Admitting: *Deleted

## 2023-01-02 MED ORDER — NA SULFATE-K SULFATE-MG SULF 17.5-3.13-1.6 GM/177ML PO SOLN: 1.0000 | Freq: Once | ORAL | 0 refills | Status: AC

## 2023-01-02 NOTE — Telephone Encounter (Signed)
Patient is calling states her pharmacy has not received her prep medication. Please advise 

## 2023-01-02 NOTE — Telephone Encounter (Signed)
LM with pt informing them RX was sent to the [facility] on [address]. Gave contact # for call back if had any questions.

## 2023-01-02 NOTE — Telephone Encounter (Signed)
Attempted to reach pt to inform her of medication being sent to [facility] on [address]. LM with this information and gave call back # if had any questions.

## 2023-01-02 NOTE — Telephone Encounter (Signed)
Left message for patient to contact pharmacy and request that they fill the RX again- as the patient had her PV appt on 03/22 and apparently did not pick the RX in a timely manner;  Also advised patient to call back to the office at [phone] should questions/concerns arise/if pharmacy unable to fill RX that is needed tomorrow for her prep to begin;

## 2023-01-04 ENCOUNTER — Encounter: Payer: Self-pay | Admitting: Gastroenterology

## 2023-01-04 ENCOUNTER — Ambulatory Visit (AMBULATORY_SURGERY_CENTER): Payer: Managed Care, Other (non HMO) | Admitting: Gastroenterology

## 2023-01-04 VITALS — BP 128/87 | HR 72 | Temp 98.4°F | Resp 16 | Ht 68.0 in | Wt 155.0 lb

## 2023-01-04 DIAGNOSIS — Z1211 Encounter for screening for malignant neoplasm of colon: Secondary | ICD-10-CM

## 2023-01-04 MED ORDER — SODIUM CHLORIDE 0.9 % IV SOLN
500.0000 mL | INTRAVENOUS | Status: DC
Start: 2023-01-04 — End: 2023-01-04

## 2023-01-04 NOTE — Progress Notes (Signed)
[facility] History and Physical   Primary Care Physician:  Early, Sung Amabile, NP   Reason for Procedure:    CRC screening  Plan:    colon     HPI: Haley Welch is a 47 y.o. female    Past Medical History:  Diagnosis Date   Anemia    Mitral valve prolapse    Palpitations    Thyroid nodule 01/04/2022    Past Surgical History:  Procedure Laterality Date   LAPAROSCOPIC HYSTERECTOMY  2021    Prior to Admission medications   Medication Sig Start Date End Date Taking? Authorizing Provider  ibuprofen (ADVIL) 200 MG tablet Take 200 mg by mouth every 6 (six) hours as needed. Patient not taking: Reported on 01/04/2023    [provider]    Current Outpatient Medications  Medication Sig Dispense Refill   ibuprofen (ADVIL) 200 MG tablet Take 200 mg by mouth every 6 (six) hours as needed. (Patient not taking: Reported on 01/04/2023)     Current Facility-Administered Medications  Medication Dose Route Frequency Provider Last Rate Last Admin   0.9 %  sodium chloride infusion  500 mL Intravenous Continuous Lynann Bologna, MD        Allergies as of 01/04/2023   (No Known Allergies)    Family History  Problem Relation Age of Onset   Colon cancer Neg Hx    Esophageal cancer Neg Hx    Rectal cancer Neg Hx    Stomach cancer Neg Hx     Social History   Socioeconomic History   Marital status: Married    Spouse name: Not on file   Number of children: Not on file   Years of education: Not on file   Highest education level: Not on file  Occupational History   Not on file  Tobacco Use   Smoking status: Never   Smokeless tobacco: Never  Substance and Sexual Activity   Alcohol use: Not Currently   Drug use: Never   Sexual activity: Yes    Birth control/protection: None  Other Topics Concern   Not on file  Social History Narrative   Not on file   Social Determinants of Health   Financial Resource Strain: Not on file  Food Insecurity: Not on file   Transportation Needs: Not on file  Physical Activity: Not on file  Stress: Not on file  Social Connections: Not on file  Intimate Partner Violence: Not on file    Review of Systems: Positive for none All other review of systems negative except as mentioned in the HPI.  Physical Exam: Vital signs in last 24 hours: @   General:   Alert,  Well-developed, well-nourished, pleasant and cooperative in NAD Lungs:  Clear throughout to auscultation.   Heart:  Regular rate and rhythm; no murmurs, clicks, rubs,  or gallops. Abdomen:  Soft, nontender and nondistended. Normal bowel sounds.   Neuro/Psych:  Alert and cooperative. Normal mood and affect. A and O x 3    No significant changes were identified.  The patient continues to be an appropriate candidate for the planned procedure and anesthesia.   Edman Circle, MD. [facility] 01/04/2023 10:37 AM@

## 2023-01-04 NOTE — Patient Instructions (Signed)
YOU HAD AN ENDOSCOPIC PROCEDURE TODAY AT THE [facility]:   Refer to the procedure report that was given to you for any specific questions about what was found during the examination.  If the procedure report does not answer your questions, please call your gastroenterologist to clarify.  If you requested that your care partner not be given the details of your procedure findings, then the procedure report has been included in a sealed envelope for you to review at your convenience later.  YOU SHOULD EXPECT: Some feelings of bloating in the abdomen. Passage of more gas than usual.  Walking can help get rid of the air that was put into your GI tract during the procedure and reduce the bloating. If you had a lower endoscopy (such as a colonoscopy or flexible sigmoidoscopy) you may notice spotting of blood in your stool or on the toilet paper. If you underwent a bowel prep for your procedure, you may not have a normal bowel movement for a few days.  Please Note:  You might notice some irritation and congestion in your nose or some drainage.  This is from the oxygen used during your procedure.  There is no need for concern and it should clear up in a day or so.  SYMPTOMS TO REPORT IMMEDIATELY:  Following lower endoscopy (colonoscopy or flexible sigmoidoscopy):  Excessive amounts of blood in the stool  Significant tenderness or worsening of abdominal pains  Swelling of the abdomen that is new, acute  Fever of 100F or higher  For urgent or emergent issues, a gastroenterologist can be reached at any hour by calling [phone]. Do not use MyChart messaging for urgent concerns.    DIET:  We do recommend a small meal at first, but then you may proceed to your regular diet.  Drink plenty of fluids but you should avoid alcoholic beverages for 24 hours.  ACTIVITY:  You should plan to take it easy for the rest of today and you should NOT DRIVE or use heavy machinery until tomorrow (because of  the sedation medicines used during the test).    FOLLOW UP: Our staff will call the number listed on your records the next business day following your procedure.  We will call around 7:15- 8:00 am to check on you and address any questions or concerns that you may have regarding the information given to you following your procedure. If we do not reach you, we will leave a message.     If any biopsies were taken you will be contacted by phone or by letter within the next 1-3 weeks.  Please call us at [phone] if you have not heard about the biopsies in 3 weeks.    SIGNATURES/CONFIDENTIALITY: You and/or your care partner have signed paperwork which will be entered into your electronic medical record.  These signatures attest to the fact that that the information above on your After Visit Summary has been reviewed and is understood.  Full responsibility of the confidentiality of this discharge information lies with you and/or your care-partner.  

## 2023-01-04 NOTE — Op Note (Signed)
[facility] Patient Name: Haley Welch Procedure Date: 01/04/2023 10:38 AM MRN: [id number] Endoscopist: Lynann Bologna , MD, [id number] Age: 47 Referring MD:  Date of Birth: 1975-10-27 Gender: Female Account #: [id number] Procedure:                Colonoscopy Indications:              Screening for colorectal malignant neoplasm Medicines:                Monitored Anesthesia Care Procedure:                Pre-Anesthesia Assessment:                           - Prior to the procedure, a History and Physical                            was performed, and patient medications and                            allergies were reviewed. The patient's tolerance of                            previous anesthesia was also reviewed. The risks                            and benefits of the procedure and the sedation                            options and risks were discussed with the patient.                            All questions were answered, and informed consent                            was obtained. Prior Anticoagulants: The patient has                            taken no anticoagulant or antiplatelet agents. ASA                            Grade Assessment: I - A normal, healthy patient.                            After reviewing the risks and benefits, the patient                            was deemed in satisfactory condition to undergo the                            procedure.                           After obtaining informed consent, the colonoscope  was passed under direct vision. Throughout the                            procedure, the patient's blood pressure, pulse, and                            oxygen saturations were monitored continuously. The                            Olympus CF-HQ190L SN [id number] was introduced through                            the anus and advanced to the 2 cm into the ileum.                            The colonoscopy was  performed without difficulty.                            The patient tolerated the procedure well. The                            quality of the bowel preparation was good. The                            terminal ileum, ileocecal valve, appendiceal                            orifice, and rectum were photographed. Scope In: 10:43:14 AM Scope Out: 11:02:28 AM Scope Withdrawal Time: 0 hours 14 minutes 13 seconds  Total Procedure Duration: 0 hours 19 minutes 14 seconds  Findings:                 The colon (entire examined portion) appeared normal.                           Non-bleeding internal hemorrhoids were found during                            retroflexion. The hemorrhoids were small and Grade                            I (internal hemorrhoids that do not prolapse).                           The terminal ileum appeared normal.                           The exam was otherwise without abnormality on                            direct and retroflexion views. The colon was                            somewhat tortuous. Complications:  No immediate complications. Estimated Blood Loss:     Estimated blood loss: none. Impression:               - Small internal hemorrhoids.                           - Otherwise normal colonoscopy to TI.                           - The GI Genius (intelligent endoscopy module),                            computer-aided polyp detection system powered by AI                            was utilized to detect colorectal polyps through                            enhanced visualization during colonoscopy. Recommendation:           - Patient has a contact number available for                            emergencies. The signs and symptoms of potential                            delayed complications were discussed with the                            patient. Return to normal activities tomorrow.                            Written discharge instructions were  provided to the                            patient.                           - Resume previous diet.                           - Continue present medications.                           - Repeat colonoscopy in 10 years for screening                            purposes. Earlier, if with any new problems or                            change in family history.                           - The findings and recommendations were discussed                            with  the patient's family. Lynann Bologna, MD 01/04/2023 11:06:54 AM This report has been signed electronically.

## 2023-01-04 NOTE — Progress Notes (Signed)
Pt's states no medical or surgical changes since previsit or office visit. 

## 2023-01-04 NOTE — Progress Notes (Signed)
Report given to PACU, vss 

## 2023-01-07 ENCOUNTER — Telehealth: Payer: Self-pay | Admitting: *Deleted

## 2023-01-07 NOTE — Telephone Encounter (Signed)
  Follow up Call-     01/04/2023   10:20 AM  Call back number  Post procedure Call Back phone  # [phone]  Permission to leave phone message Yes     Patient questions:  Do you have a fever, pain , or abdominal swelling? No. Pain Score  0 *  Have you tolerated food without any problems? Yes.    Have you been able to return to your normal activities? Yes.    Do you have any questions about your discharge instructions: Diet   No. Medications  No. Follow up visit  No.  Do you have questions or concerns about your Care? No.  Actions: * If pain score is 4 or above: No action needed, pain <4.

## 2023-01-17 IMAGING — MG MM DIGITAL SCREENING BILAT W/ TOMO AND CAD
8 series · 8 of 24 positions shown · non-contrast
Comparison: Previous exam(s).

CLINICAL DATA: Screening.

EXAM:
DIGITAL SCREENING BILATERAL MAMMOGRAM WITH TOMOSYNTHESIS AND CAD
TECHNIQUE: Bilateral screening digital craniocaudal and mediolateral oblique
mammograms were obtained. Bilateral screening digital breast
tomosynthesis was performed. The images were evaluated with
computer-aided detection.

[R CC synth-2D]
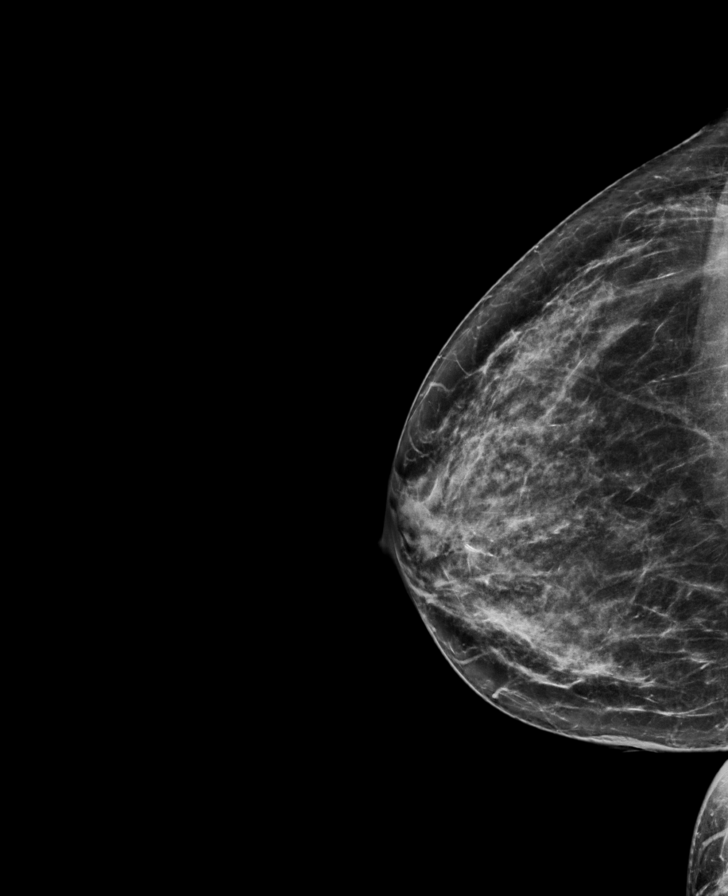

[L MLO synth-2D]
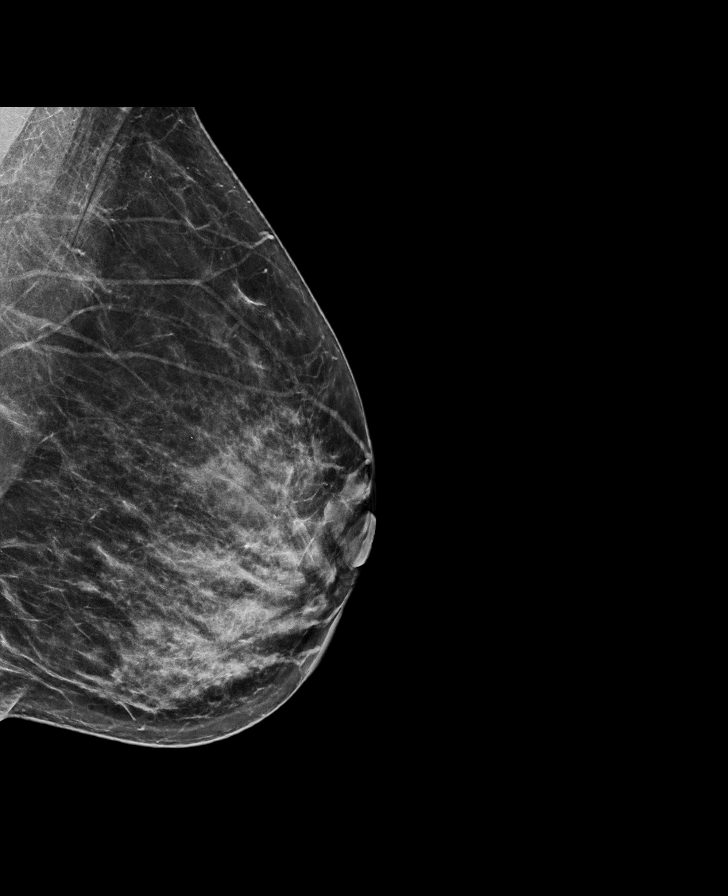

[L CC synth-2D]
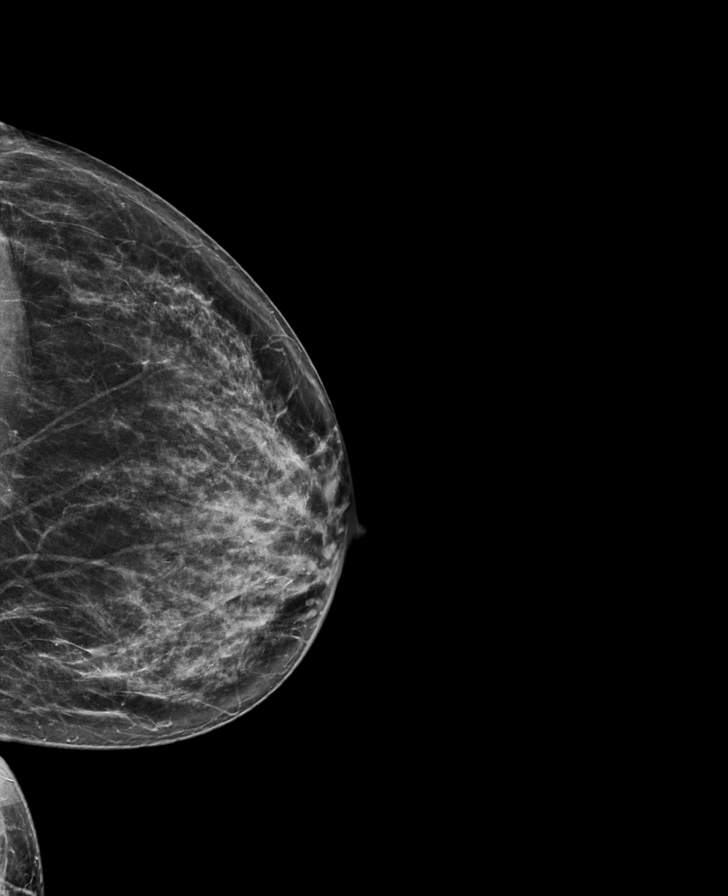

[R MLO synth-2D]
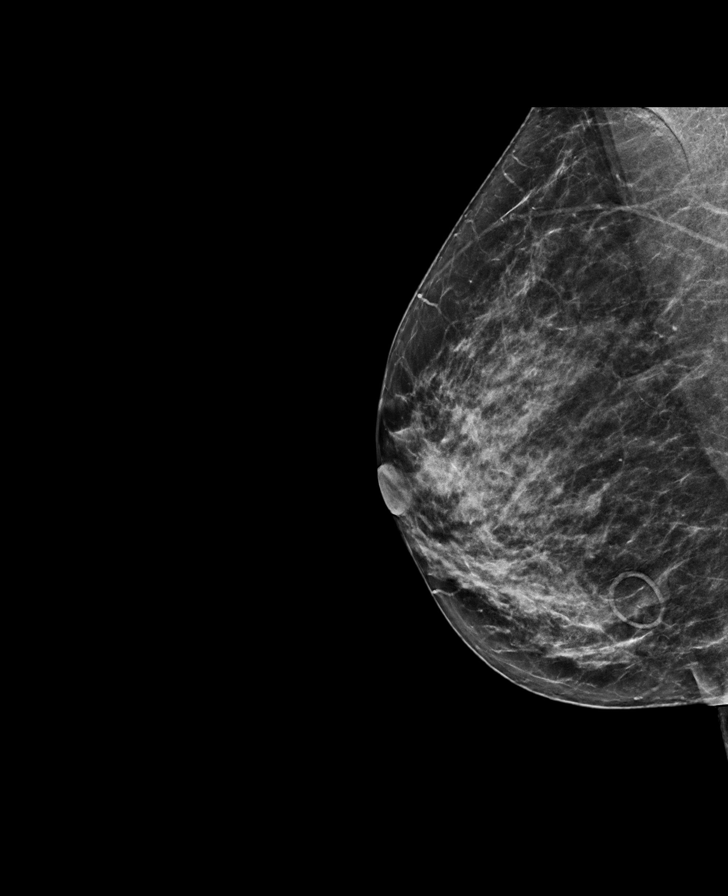

[R CC tomo · tomo slice 35/70.0]
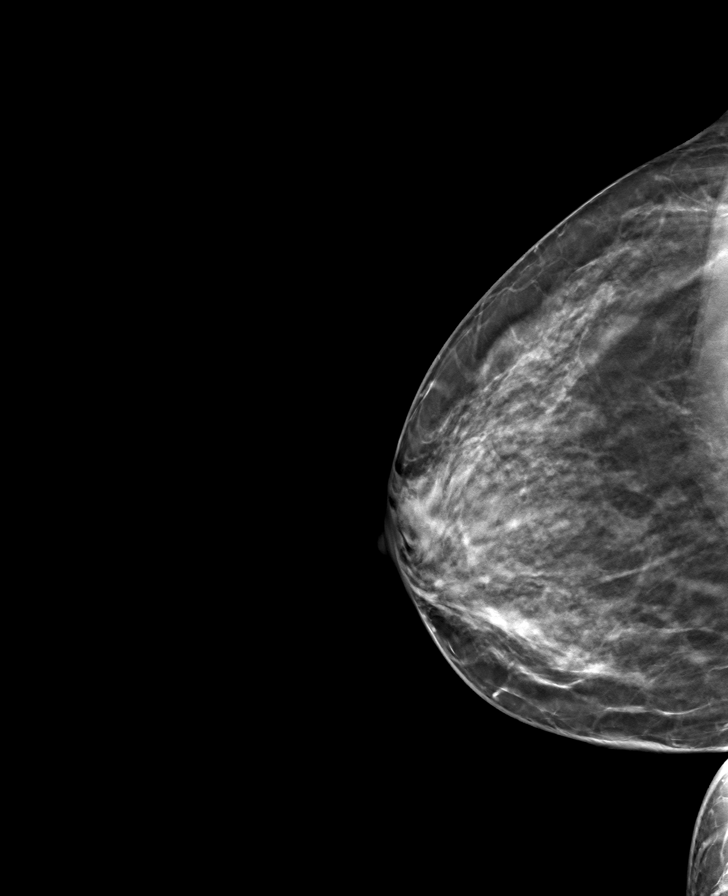

[R MLO tomo · tomo slice 34/67.0]
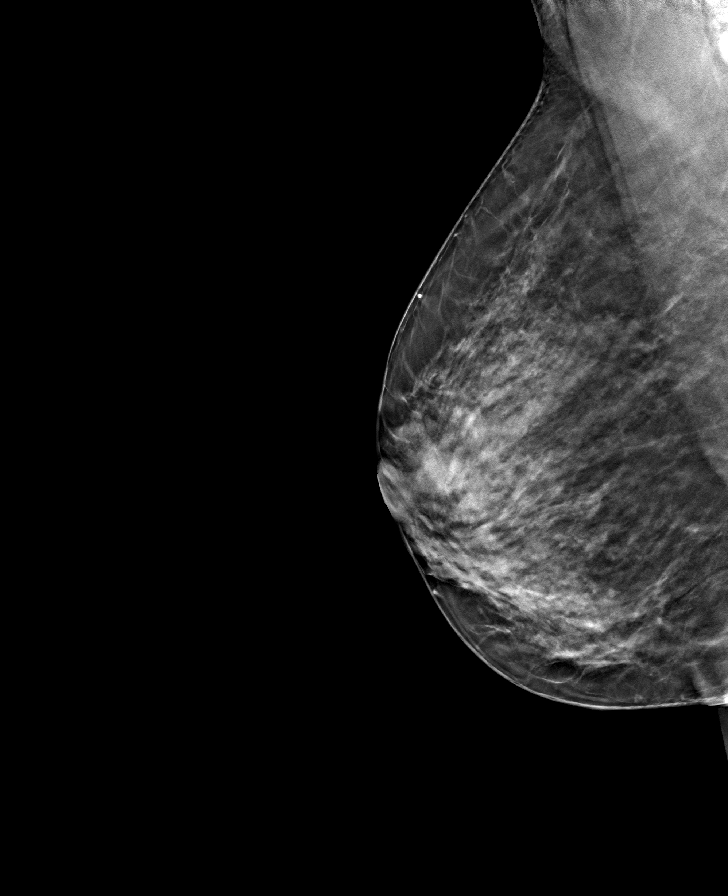

[L MLO tomo · tomo slice 35/70.0]
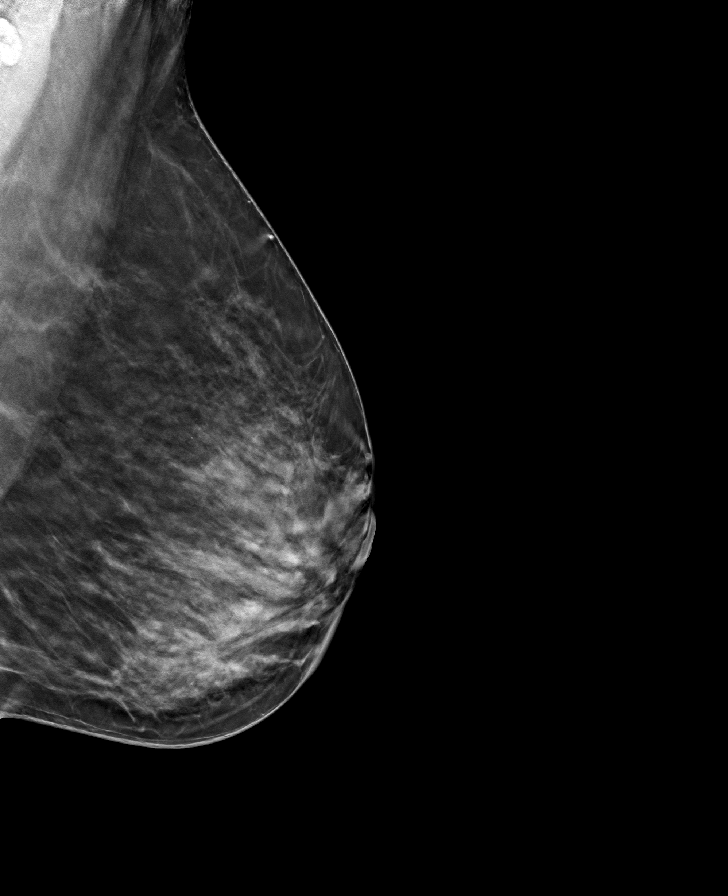

[L CC tomo · tomo slice 35/70.0]
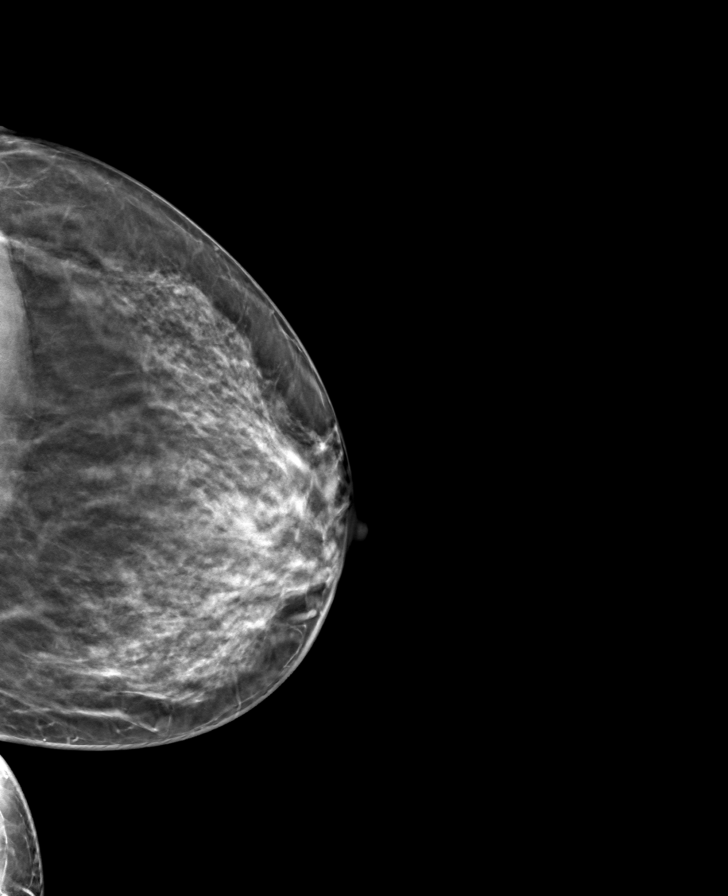

[8 of 24 positions shown; findings below may reference images not displayed]

ACR Breast Density Category c: The breast tissue is heterogeneously
dense, which may obscure small masses.
FINDINGS: There are no findings suspicious for malignancy.
IMPRESSION: No mammographic evidence of malignancy. A result letter of this
screening mammogram will be mailed directly to the patient.

RECOMMENDATION:
Screening mammogram in one year. (Code:Q3-W-BC3)

BI-RADS CATEGORY  1: Negative.

## 2023-01-18 ENCOUNTER — Other Ambulatory Visit: Payer: Managed Care, Other (non HMO)

## 2023-01-18 ENCOUNTER — Ambulatory Visit
Admission: RE | Admit: 2023-01-18 | Discharge: 2023-01-18 | Disposition: A | Discharge status: Home / Self Care | Payer: Managed Care, Other (non HMO) | Source: Ambulatory Visit | Attending: Nurse Practitioner | Admitting: Nurse Practitioner

## 2023-01-18 DIAGNOSIS — E042 Nontoxic multinodular goiter: Secondary | ICD-10-CM

## 2023-02-27 ENCOUNTER — Other Ambulatory Visit: Payer: Self-pay | Admitting: Nurse Practitioner

## 2023-02-27 DIAGNOSIS — Z1231 Encounter for screening mammogram for malignant neoplasm of breast: Secondary | ICD-10-CM

## 2023-03-01 ENCOUNTER — Ambulatory Visit
Admission: RE | Admit: 2023-03-01 | Discharge: 2023-03-01 | Disposition: A | Discharge status: Home / Self Care | Payer: Managed Care, Other (non HMO) | Source: Ambulatory Visit | Attending: Nurse Practitioner | Admitting: Nurse Practitioner

## 2023-03-01 DIAGNOSIS — Z1231 Encounter for screening mammogram for malignant neoplasm of breast: Secondary | ICD-10-CM

## 2023-06-13 ENCOUNTER — Encounter: Payer: Self-pay | Admitting: Nurse Practitioner

## 2023-06-13 ENCOUNTER — Ambulatory Visit (INDEPENDENT_AMBULATORY_CARE_PROVIDER_SITE_OTHER): Payer: Managed Care, Other (non HMO) | Admitting: Nurse Practitioner

## 2023-06-13 VITALS — BP 128/82 | HR 84 | Wt 154.2 lb

## 2023-06-13 DIAGNOSIS — R102 Pelvic and perineal pain: Secondary | ICD-10-CM

## 2023-06-13 DIAGNOSIS — E041 Nontoxic single thyroid nodule: Secondary | ICD-10-CM

## 2023-06-13 DIAGNOSIS — R0789 Other chest pain: Secondary | ICD-10-CM

## 2023-06-13 DIAGNOSIS — R2232 Localized swelling, mass and lump, left upper limb: Secondary | ICD-10-CM

## 2023-06-13 NOTE — Progress Notes (Signed)
Tollie Eth, DNP, AGNP-c [facility] [address] [place], [place] [address] [phone]   ACUTE VISIT- ESTABLISHED PATIENT  Blood pressure 128/82, pulse 84, weight 154 lb 3.2 oz (69.9 kg).  Subjective:  HPI Haley Welch is a 47 y.o. female presents to day for evaluation of acute concern(s).   History of Present Illness Haley Welch, with a history of dense breast tissue, presented with a new palpable mass in the breast. She noticed the mass four days prior during a shower and described it as painful. The patient reported a sensation in the area during the day at work. The pain and tenderness associated with the mass have been decreasing since its discovery. The patient's last mammogram was in June and was reported as normal, although the patient was informed of her dense breast tissue potentially obscuring small masses.  The patient also reported increased urinary frequency, particularly at night, following a hysterectomy where the cervix was removed but the ovaries were retained. She expressed concern about potential changes in hormonal balance and sex drive post-hysterectomy.  Additionally, the patient reported a longstanding issue with a painful nodule on the left hand. She has noticed this nodule for several years and it causes discomfort upon pressure.  Lastly, the patient has a known thyroid nodule that is being monitored annually. The nodule has been stable for two years and is due for another evaluation in May.   ROS negative except for what is listed in HPI. History, Medications, Surgery, SDOH, and Family History reviewed and updated as appropriate.  Objective:  Physical Exam Vitals and nursing note reviewed.  Constitutional:      General: She is not in acute distress.    Appearance: Normal appearance. She is not ill-appearing.  Eyes:     Conjunctiva/sclera: Conjunctivae normal.  Cardiovascular:     Rate and Rhythm: Normal rate and regular rhythm.      Pulses: Normal pulses.  Pulmonary:     Effort: Pulmonary effort is normal.  Chest:    Musculoskeletal:        General: Normal range of motion.     Cervical back: Normal range of motion.  Lymphadenopathy:     Cervical: No cervical adenopathy.  Skin:    General: Skin is warm and dry.  Neurological:     General: No focal deficit present.     Mental Status: She is alert and oriented to person, place, and time.  Psychiatric:        Mood and Affect: Mood normal.        Behavior: Behavior normal.         Assessment & Plan:   Problem List Items Addressed This Visit     Thyroid nodule    Stable thyroid nodule, currently in year two of five-year monitoring plan. -Plan to repeat thyroid ultrasound in May for year three of monitoring.      Acute chest wall pain - Primary    New palpable area of tenderness superior to the left breast with mild edema noted to the tissue. Last mammogram in June was normal. Dense breast tissue noted. No skin changes or nipple discharge. Unclear if this is costochondral changes.  -Order breast ultrasound for further evaluation.      Relevant Orders   [place] CHEST SOFT TISSUE   Nodule of finger of left hand    Chronic painful nodule on left hand. No recent trauma or significant activity reported. -Referral to orthopedics for further evaluation and management.  Relevant Orders   Ambulatory referral to Orthopedic Surgery   Pelvic pressure in female    History of hysterectomy with preservation of ovaries. Experiencing changes in hormones and sex drive. -Continue with planned gynecology appointment for further evaluation and management.      Relevant Orders   Ambulatory referral to Urogynecology    Tollie Eth, DNP, AGNP-c

## 2023-06-13 NOTE — Patient Instructions (Addendum)
I have sent the referral for orthopedics for your hand and for urogynecology for pelvic pressure.   They wil

## 2023-06-14 ENCOUNTER — Encounter: Payer: Self-pay | Admitting: Medical

## 2023-06-14 ENCOUNTER — Telehealth (INDEPENDENT_AMBULATORY_CARE_PROVIDER_SITE_OTHER): Payer: Managed Care, Other (non HMO) | Admitting: Medical

## 2023-06-14 VITALS — Ht 68.5 in | Wt 155.0 lb

## 2023-06-14 DIAGNOSIS — R0981 Nasal congestion: Secondary | ICD-10-CM

## 2023-06-14 DIAGNOSIS — U071 COVID-19: Secondary | ICD-10-CM | POA: Diagnosis not present

## 2023-06-14 NOTE — Progress Notes (Signed)
Subjective:     Patient ID: Haley Welch, female   DOB: [birth year], 47 y.o.   MRN: [id number]  This visit type was conducted due to national recommendations for restrictions regarding the COVID-19 Pandemic (e.g. social distancing) in an effort to limit this patient's exposure and mitigate transmission in our community.  Due to their co-morbid illnesses, this patient is at least at moderate risk for complications without adequate follow up.  This format is felt to be most appropriate for this patient at this time.    Documentation for virtual audio and video telecommunications through [place] encounter:  The patient was located at home. The provider was located in the office. The patient did consent to this visit and is aware of possible charges through their insurance for this visit.  The other persons participating in this telemedicine service were none. Time spent on call was 20 minutes and in review of previous records 20 minutes total.  This virtual service is not related to other E/M service within previous 7 days.   HPI Chief Complaint  Patient presents with   Covid Positive    Took last night. Congestion, light sporadic cough with mucus, fever   Virtual for illness.  She has about 4 days of mild cold-like symptoms, heavy congestion, not feeling well, some sore throat, but yesterday had a change in taste and did a COVID test and was positive for COVID.  She denies fever, chills but is having some night sweats.  No nausea vomiting or diarrhea.  No shortness of breath or wheezing.  Is hydrating okay.  Is resting.  No other aggravating or relieving factors. No other complaint.  She has taken Paxlovid before the last time she had COVID but she was much worse then,  No other aggravating or relieving factors. No other complaint.   Past Medical History:  Diagnosis Date   Anemia    Mitral valve prolapse    Palpitations    Thyroid nodule 01/04/2022   Current Outpatient  Medications on File Prior to Visit  Medication Sig Dispense Refill   ibuprofen (ADVIL) 200 MG tablet Take 200 mg by mouth every 6 (six) hours as needed. (Patient not taking: Reported on 06/14/2023)     No current facility-administered medications on file prior to visit.     Review of Systems As in subjective    Objective:   Physical Exam Due to coronavirus pandemic stay at home measures, patient visit was virtual and they were not examined in person.   Ht 5' 8.5" (1.74 m) Comment: patient reported  Wt 155 lb (70.3 kg) Comment: patient reported  BMI 23.22 kg/m   Gen: wd, wn, nad Mildly ill appearing No wheezing or labored breathing     Assessment:     Encounter Diagnoses  Name Primary?   COVID Yes   Head congestion        Plan:     She is 4 days then to her illness and has relatively mild symptoms.  Advise rest, good hydration, can use over-the-counter analgesic for not feeling well and for headache.  Can use either allergy pill or Sudafed for congestion since she is not tolerating Mucinex.  Given that she is healthy and has milder symptoms advised against Paxlovid, she is also on day 4 of symptoms.  If much worse or new symptoms of the weekend then get reevaluated.  Discussed quarantine measures through the next 48 hours.   Haley Welch was seen today for covid positive.  Diagnoses and  all orders for this visit:  COVID  Head congestion  F/u prn

## 2023-06-17 ENCOUNTER — Ambulatory Visit
Admission: RE | Admit: 2023-06-17 | Discharge: 2023-06-17 | Disposition: A | Discharge status: Home / Self Care | Payer: Managed Care, Other (non HMO) | Source: Ambulatory Visit | Attending: Nurse Practitioner | Admitting: Nurse Practitioner

## 2023-06-17 DIAGNOSIS — R0789 Other chest pain: Secondary | ICD-10-CM | POA: Insufficient documentation

## 2023-06-17 DIAGNOSIS — R102 Pelvic and perineal pain: Secondary | ICD-10-CM | POA: Insufficient documentation

## 2023-06-17 DIAGNOSIS — R2232 Localized swelling, mass and lump, left upper limb: Secondary | ICD-10-CM | POA: Insufficient documentation

## 2023-06-17 HISTORY — DX: Other chest pain: R07.89

## 2023-06-17 NOTE — Assessment & Plan Note (Signed)
History of hysterectomy with preservation of ovaries. Experiencing changes in hormones and sex drive. -Continue with planned gynecology appointment for further evaluation and management.

## 2023-06-17 NOTE — Assessment & Plan Note (Signed)
Stable thyroid nodule, currently in year two of five-year monitoring plan. -Plan to repeat thyroid ultrasound in May for year three of monitoring.

## 2023-06-17 NOTE — Assessment & Plan Note (Signed)
Chronic painful nodule on left hand. No recent trauma or significant activity reported. -Referral to orthopedics for further evaluation and management.

## 2023-06-17 NOTE — Assessment & Plan Note (Signed)
New palpable area of tenderness superior to the left breast with mild edema noted to the tissue. Last mammogram in June was normal. Dense breast tissue noted. No skin changes or nipple discharge. Unclear if this is costochondral changes.  -Order breast ultrasound for further evaluation.

## 2023-07-10 NOTE — Progress Notes (Signed)
47 y.o. G2P2 Married [place] American female here for annual exam/new patient appointment.  She has experienced some hot flashes.  This has improved more recently.  She has noticed some changed in urinary symptoms, some urgency.  Reports it just feels different.  Wonders about relation to prior hysterectomy.    No LMP recorded. Patient has had a hysterectomy.          Sexually active: Yes.    The current method of family planning is status post hysterectomy.    Smoker:  no  Health Maintenance: Pap:  2019 History of abnormal Pap:  yes MMG:  03/01/2023 Negative Colonoscopy:  01/04/2023, follow up 10 years BMD: not indicated Screening Labs: reviewed from 05/2022   reports that she has never smoked. She has never used smokeless tobacco. She reports that she does not currently use alcohol. She reports that she does not use drugs.  Past Medical History:  Diagnosis Date   Anemia    Mitral valve prolapse    Palpitations    Thyroid nodule 01/04/2022    Past Surgical History:  Procedure Laterality Date   LAPAROSCOPIC HYSTERECTOMY  2021    No current outpatient medications on file.   No current facility-administered medications for this visit.    Family History  Problem Relation Age of Onset   Colon cancer Neg Hx    Esophageal cancer Neg Hx    Rectal cancer Neg Hx    Stomach cancer Neg Hx     ROS: Constitutional: negative Genitourinary:negative  Exam:   BP 130/86 (BP Location: Right Arm, Patient Position: Sitting, Cuff Size: Large)   Pulse 76   Ht 5' 7.75" (1.721 m)   Wt 153 lb 12.8 oz (69.8 kg)   BMI 23.56 kg/m   Height: 5' 7.75" (172.1 cm)  General appearance: alert, cooperative and appears stated age Head: Normocephalic, without obvious abnormality, atraumatic Neck: no adenopathy, supple, symmetrical, trachea midline and thyroid mildly enlarged with palpable nodule on right side Lungs: clear to auscultation bilaterally Breasts: normal appearance, no masses  or tenderness Heart: regular rate and rhythm Abdomen: soft, non-tender; bowel sounds normal; no masses,  no organomegaly Extremities: extremities normal, atraumatic, no cyanosis or edema Skin: Skin color, texture, turgor normal. No rashes or lesions Lymph nodes: Cervical, supraclavicular, and axillary nodes normal. No abnormal inguinal nodes palpated Neurologic: Grossly normal   Pelvic: External genitalia:  no lesions              Urethra:  normal appearing urethra with no masses, tenderness or lesions              Bartholins and Skenes: normal                 Vagina: normal appearing vagina with normal color and no discharge, no lesions, no significant prolapse noted              Cervix: absent              Pap taken: Yes.   Bimanual Exam:  Uterus:  uterus absent              Adnexa: no mass, fullness, tenderness               Rectovaginal: Confirms               Anus:  normal sphincter tone, no lesions  Chaperone, Raechel Ache, RN, was present for exam.  Assessment/Plan: 1. Well woman exam with routine gynecological exam -  Pap smear and HR HPV obtained today - Mammogram 03/01/2023 - Colonoscopy 01/04/2023, follow up 10 years - Bone mineral density not indicated - lab work done and reviewed from 05/2022 - vaccines reviewed/updated  2. 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 Thyroid nodule - will need repeat thyroid u/s next year.  Pt aware.  5. Pelvic floor dysfunction - Ambulatory referral to Physical Therapy

## 2023-07-11 ENCOUNTER — Other Ambulatory Visit (HOSPITAL_COMMUNITY)
Admission: RE | Admit: 2023-07-11 | Discharge: 2023-07-11 | Disposition: A | Discharge status: Home / Self Care | Payer: Managed Care, Other (non HMO) | Source: Ambulatory Visit | Attending: Obstetrics & Gynecology | Admitting: Obstetrics & Gynecology

## 2023-07-11 ENCOUNTER — Encounter (HOSPITAL_BASED_OUTPATIENT_CLINIC_OR_DEPARTMENT_OTHER): Payer: Self-pay | Admitting: Obstetrics & Gynecology

## 2023-07-11 ENCOUNTER — Ambulatory Visit (INDEPENDENT_AMBULATORY_CARE_PROVIDER_SITE_OTHER): Payer: Managed Care, Other (non HMO) | Admitting: Obstetrics & Gynecology

## 2023-07-11 VITALS — BP 130/86 | HR 76 | Ht 67.75 in | Wt 153.8 lb

## 2023-07-11 DIAGNOSIS — Z124 Encounter for screening for malignant neoplasm of cervix: Secondary | ICD-10-CM | POA: Diagnosis present

## 2023-07-11 DIAGNOSIS — E041 Nontoxic single thyroid nodule: Secondary | ICD-10-CM

## 2023-07-11 DIAGNOSIS — Z01419 Encounter for gynecological examination (general) (routine) without abnormal findings: Secondary | ICD-10-CM | POA: Diagnosis not present

## 2023-07-11 DIAGNOSIS — B977 Papillomavirus as the cause of diseases classified elsewhere: Secondary | ICD-10-CM

## 2023-07-11 DIAGNOSIS — M6289 Other specified disorders of muscle: Secondary | ICD-10-CM | POA: Diagnosis not present

## 2023-07-11 DIAGNOSIS — Z23 Encounter for immunization: Secondary | ICD-10-CM

## 2023-07-15 ENCOUNTER — Ambulatory Visit: Payer: Managed Care, Other (non HMO) | Admitting: Orthopedic Surgery

## 2023-07-16 LAB — CYTOLOGY - PAP
Adequacy: ABSENT
Comment: NEGATIVE
Diagnosis: NEGATIVE
High risk HPV: NEGATIVE

## 2023-08-14 ENCOUNTER — Other Ambulatory Visit (HOSPITAL_COMMUNITY)
Admission: RE | Admit: 2023-08-14 | Discharge: 2023-08-14 | Disposition: A | Discharge status: Home / Self Care | Payer: Managed Care, Other (non HMO) | Source: Ambulatory Visit | Attending: Certified Nurse Midwife | Admitting: Certified Nurse Midwife

## 2023-08-14 ENCOUNTER — Ambulatory Visit (INDEPENDENT_AMBULATORY_CARE_PROVIDER_SITE_OTHER): Payer: Managed Care, Other (non HMO) | Admitting: Certified Nurse Midwife

## 2023-08-14 ENCOUNTER — Encounter (HOSPITAL_BASED_OUTPATIENT_CLINIC_OR_DEPARTMENT_OTHER): Payer: Self-pay | Admitting: Certified Nurse Midwife

## 2023-08-14 VITALS — BP 117/92 | HR 79 | Ht 67.75 in | Wt 154.8 lb

## 2023-08-14 DIAGNOSIS — Z202 Contact with and (suspected) exposure to infections with a predominantly sexual mode of transmission: Secondary | ICD-10-CM | POA: Insufficient documentation

## 2023-08-14 DIAGNOSIS — N898 Other specified noninflammatory disorders of vagina: Secondary | ICD-10-CM | POA: Diagnosis not present

## 2023-08-14 NOTE — Progress Notes (Signed)
  Brunette is a 47yo Married female here for problem gyn visit. Pt has three small areas/lesions on vulva. These appeared four days ago. Pt states she experiences 2 of these in the exact same spots intermittently (sometimes years between episodes). The lesions are "uncomfortable" but not "painful". She has been monogamous x 20 years with her spouse. There is no risk of STI.   The following portions of the patient's history were reviewed and updated as appropriate: allergies, current medications, past family history, past medical history, past social history, past surgical history, and problem list.   Review of Systems Pertinent items are noted in HPI.    Objective:    BP (!) 117/92 (BP Location: Right Arm, Patient Position: Sitting, Cuff Size: Normal)   Pulse 79   Ht 5' 7.75" (1.721 m)   Wt 154 lb 12.8 oz (70.2 kg)   BMI 23.71 kg/m  Pelvic:  on external vulvar area there are two tiny mm lesions in close proximity to the right of clitoral region. Areas almost appear as tiny lacerations. No surrounding erythema noted.     Assessment:    Vulvar lesions, recurrent .    Plan:    HSV culture obtained Serum screening for HSV1, HSV2. May start Valtrex 1gm po daily while awaiting lab results Letta Kocher

## 2023-08-15 LAB — HSV 1 AND 2 AB, IGG
HSV 1 Glycoprotein G Ab, IgG: NONREACTIVE
HSV 2 IgG, Type Spec: REACTIVE — AB

## 2023-08-21 LAB — CERVICOVAGINAL ANCILLARY ONLY
Comment: NEGATIVE
HSV1: NEGATIVE
HSV2: NEGATIVE

## 2023-09-13 ENCOUNTER — Other Ambulatory Visit (HOSPITAL_BASED_OUTPATIENT_CLINIC_OR_DEPARTMENT_OTHER): Payer: Self-pay | Admitting: Certified Nurse Midwife

## 2023-09-13 MED ORDER — VALACYCLOVIR HCL 1 G PO TABS: 1000.0000 mg | ORAL_TABLET | Freq: Every day | ORAL | 2 refills | Status: DC

## 2023-09-13 NOTE — Addendum Note (Signed)
Addended byMerrilee Jansky on: 09/13/2023 09:41 AM   Modules accepted: Orders

## 2024-01-01 ENCOUNTER — Ambulatory Visit (INDEPENDENT_AMBULATORY_CARE_PROVIDER_SITE_OTHER): Payer: Managed Care, Other (non HMO) | Admitting: Obstetrics and Gynecology

## 2024-01-01 ENCOUNTER — Encounter: Payer: Self-pay | Admitting: Obstetrics and Gynecology

## 2024-01-01 VITALS — BP 136/92 | HR 84 | Ht 67.32 in | Wt 152.8 lb

## 2024-01-01 DIAGNOSIS — M6289 Other specified disorders of muscle: Secondary | ICD-10-CM | POA: Diagnosis not present

## 2024-01-01 DIAGNOSIS — R350 Frequency of micturition: Secondary | ICD-10-CM | POA: Diagnosis not present

## 2024-01-01 LAB — POCT URINALYSIS DIPSTICK
Bilirubin, UA: NEGATIVE
Blood, UA: NEGATIVE
Glucose, UA: NEGATIVE
Ketones, UA: NEGATIVE
Leukocytes, UA: NEGATIVE
Nitrite, UA: NEGATIVE
Protein, UA: NEGATIVE
Spec Grav, UA: 1.02 (ref 1.010–1.025)
Urobilinogen, UA: 1 U/dL
pH, UA: 8.5 — AB (ref 5.0–8.0)

## 2024-01-01 NOTE — Progress Notes (Signed)
 [name]  Referring Provider: Early, Sung Amabile, NP PCP: Tollie Eth, NP Date of Service: 01/01/2024  SUBJECTIVE Chief Complaint: New Patient (Initial Visit) (Haley Welch is a 48 y.o. female here today for urinary frequency.)  History of Present Illness: Haley Welch is a 48 y.o. Black or African-American female seen in consultation at the request of Dr. Arbie Cookey for evaluation of pelvic pressure and urinary issues.    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    Day time voids 6.  Nocturia: 1 times per night to void. Voiding dysfunction:  empties bladder well.  Patient does not use a catheter to empty bladder.  When urinating, patient feels she has no difficulties Drinks: 32 oz Water or 8-12 sparkling water per day  UTIs: 0 UTI's in the last year.   Denies history of blood in urine, kidney or bladder stones, pyelonephritis, bladder cancer, and kidney cancer No results found for the last 90 days.   Pelvic Organ Prolapse Symptoms:                  Patient Denies a feeling of a bulge the vaginal area.  Bowel Symptom: Bowel movements: 1 time(s) per day Stool consistency: soft  Straining: no.  Splinting: no.  Incomplete evacuation: no.  Patient Denies accidental bowel leakage / fecal incontinence Bowel regimen: diet Last colonoscopy: Date 2024, Results Normal HM Colonoscopy          Upcoming     Colonoscopy (Every 10 Years) Next due on 01/03/2033    01/04/2023  COLONOSCOPY   Only the first 1 history entries have been loaded, but more history exists.                Sexual Function Sexually active: yes.  Sexual orientation: Straight Pain with sex: No  Pelvic Pain Denies pelvic pain    Past Medical History:  Past Medical History:  Diagnosis Date   Anemia    Mitral valve prolapse     Palpitations    Thyroid nodule 01/04/2022     Past Surgical History:   Past Surgical History:  Procedure Laterality Date   LAPAROSCOPIC HYSTERECTOMY  2021     Past OB/GYN History: [id number] Vaginal deliveries: 2,  Forceps/ Vacuum deliveries: 0, Cesarean section: 0 Menopausal: No Contraception: Hyst. Last pap smear was 2024.  Any history of abnormal pap smears: yes. HM PAP   This patient has no relevant Health Maintenance data.     Medications: Patient has a current medication list which includes the following prescription(s): multivitamin.   Allergies: Patient has no known allergies.   Social History:  Social History   Tobacco Use   Smoking status: Never   Smokeless tobacco: Never  Vaping Use   Vaping status: Never Used  Substance Use Topics   Alcohol use: Not Currently   Drug use: Never    Relationship status: married Patient lives with husband and children.   Patient is employed Film/video editor. Regular exercise: Yes: Walking outdoors and treadmill.  History of abuse: No  Family History:   Family History  Problem Relation Age of Onset   Colon cancer Neg Hx    Esophageal cancer Neg Hx    Rectal cancer Neg Hx    Stomach cancer Neg Hx    Uterine cancer Neg Hx    Bladder Cancer Neg Hx  Renal cancer Neg Hx      Review of Systems: Review of Systems  Constitutional:  Negative for chills and fever.  Respiratory:  Negative for cough and shortness of breath.   Cardiovascular:  Negative for chest pain and palpitations.  Gastrointestinal:  Negative for abdominal pain, blood in stool, constipation and diarrhea.  Skin:  Negative for rash.  Neurological:  Negative for weakness.  Psychiatric/Behavioral:  Negative for depression and suicidal ideas.      OBJECTIVE Physical Exam: Vitals:   01/01/24 0952 01/01/24 1037  BP: (!) 141/93 (!) 136/92  Pulse: 92 84  Weight: 152 lb 12.8 oz (69.3 kg)   Height: 5' 7.32" (1.71 m)     Physical  Exam Constitutional:      Appearance: Normal appearance.  Pulmonary:     Effort: Pulmonary effort is normal.  Abdominal:     Palpations: Abdomen is soft.  Neurological:     General: No focal deficit present.     Mental Status: She is alert and oriented to person, place, and time.  Psychiatric:        Mood and Affect: Mood normal.        Behavior: Behavior normal. Behavior is cooperative.        Thought Content: Thought content normal.     GU / Detailed Urogynecologic Evaluation:  Pelvic Exam: Normal external female genitalia; Bartholin's and Skene's glands normal in appearance; urethral meatus normal in appearance, no urethral masses or discharge.   CST: negative  s/p hysterectomy: Speculum exam reveals normal vaginal mucosa without  atrophy and normal vaginal cuff.  Adnexa normal adnexa.    Pelvic floor strength II/V  Pelvic floor musculature: Right levator non-tender, Right obturator non-tender, Left levator non-tender, Left obturator non-tender  POP-Q:   POP-Q  -2                                            Aa   -2                                           Ba  -9                                              C   3.5                                            Gh  3                                            Pb  10.5                                            tvl   -3  Ap  -3                                            Bp                                                 D      Rectal Exam:  Normal external exam.   Post-Void Residual (PVR) by Bladder Scan: In order to evaluate bladder emptying, we discussed obtaining a postvoid residual and patient agreed to this procedure.  Procedure: The ultrasound unit was placed on the patient's abdomen in the suprapubic region after the patient had voided.    Post Void Residual - 01/01/24 1007       Post Void Residual   Post Void Residual 3 mL               Laboratory Results: Lab Results  Component Value Date   COLORU Yellow 01/01/2024   CLARITYU Clear 01/01/2024   GLUCOSEUR Negative 01/01/2024   BILIRUBINUR Negative 01/01/2024   KETONESU Negative 01/01/2024   SPECGRAV 1.020 01/01/2024   RBCUR Negative 01/01/2024   PHUR 8.5 (A) 01/01/2024   PROTEINUR Negative 01/01/2024   UROBILINOGEN 1.0 01/01/2024   LEUKOCYTESUR Negative 01/01/2024    Lab Results  Component Value Date   CREATININE 0.70 08/21/2021    No results found for: "HGBA1C"  Lab Results  Component Value Date   HGB 12.9 08/21/2021     ASSESSMENT AND PLAN Ms. Welch is a 48 y.o. with:  1. Urinary frequency   2. Pelvic floor dysfunction in female    We discussed the symptoms of overactive bladder (OAB), which include urinary urgency, urinary frequency, nocturia, with or without urge incontinence.  While we do not know the exact etiology of OAB, several treatment options exist. We discussed management including behavioral therapy (decreasing bladder irritants, urge suppression strategies, timed voids, bladder retraining), physical therapy, and medication. Patient's frequency/urgency is not over 6 daily and 1 at night. We discussed working on lifestyle changes first such as increase water to reduce urine concentration, avoiding acidic flavoring in her sparkling water, trying not to push to urinate and sitting to relax her bladder as she hovers in public places to pee.  Patient has very tense pelvic floor muscles but denies pain. I do have concerns that her urgency is related to the tension in the pelvic floor as well as her concentrated urine. New pelvic floor PT referral placed and patient given the number to call.   Patient to follow up in 6 months or sooner if needed.     Donnis Phaneuf G Angad Nabers, NP

## 2024-01-01 NOTE — Patient Instructions (Signed)
 Today we talked about ways to manage bladder urgency such as altering your diet to avoid irritative beverages and foods (bladder diet) as well as attempting to decrease stress and other exacerbating factors.    The Most Bothersome Foods* The Least Bothersome Foods*  Coffee - Regular & Decaf Tea - caffeinated Carbonated beverages - cola, non-colas, diet & caffeine-free Alcohols - Beer, Red Wine, White Wine, [address] - Grapefruit, [place], Orange, [employer] - Cranberry, Grapefruit, Orange, Pineapple Vegetables - Tomato & Tomato Products Flavor Enhancers - Hot peppers, Spicy foods, Chili, Horseradish, Vinegar, Monosodium glutamate (MSG) Artificial Sweeteners - NutraSweet, Sweet 'N Low, Equal (sweetener), Saccharin Ethnic foods - [place], [place], [place] food [employer] - low-fat & whole Fruits - Bananas, Blueberries, Honeydew melon, Pears, Raisins, Watermelon Vegetables - Broccoli, [address] Sprouts, [place], Carrots, Cauliflower, [place], Cucumber, Mushrooms, Peas, Radishes, Squash, Zucchini, White potatoes, Sweet potatoes & yams Poultry - Chicken, Eggs, [place], [employer] - Beef, Diplomatic Services operational officer, Lamb Seafood - Shrimp, [place] fish, Salmon Grains - Oat, Rice Snacks - Pretzels, Popcorn  *Theodosia Fishman et al. Diet and its role in interstitial cystitis/bladder pain syndrome (IC/BPS) and comorbid conditions. [employer]. BJU Int. 2012 Jan 11.     Please focus on breathing to pee and not pushing. If you can try to sit on the toilet to relax your bladder.   You want to drink about 8 cups (64oz) of fluid daily. Try not to sip liquids but really drink 8-10oz at a time.   Please stop drinking about 2-3 hours before bedtime. This may help with your nighttime urination.

## 2024-01-21 ENCOUNTER — Telehealth: Payer: Self-pay | Admitting: Nurse Practitioner

## 2024-01-21 ENCOUNTER — Ambulatory Visit (INDEPENDENT_AMBULATORY_CARE_PROVIDER_SITE_OTHER): Admitting: Nurse Practitioner

## 2024-01-21 VITALS — BP 118/88 | HR 80 | Wt 153.8 lb

## 2024-01-21 DIAGNOSIS — R202 Paresthesia of skin: Secondary | ICD-10-CM | POA: Insufficient documentation

## 2024-01-21 DIAGNOSIS — R42 Dizziness and giddiness: Secondary | ICD-10-CM | POA: Insufficient documentation

## 2024-01-21 DIAGNOSIS — R200 Anesthesia of skin: Secondary | ICD-10-CM | POA: Diagnosis not present

## 2024-01-21 DIAGNOSIS — E041 Nontoxic single thyroid nodule: Secondary | ICD-10-CM

## 2024-01-21 NOTE — Telephone Encounter (Signed)
Pt is now here

## 2024-01-21 NOTE — Patient Instructions (Addendum)
 I have ordered labs and sent the referral to the neurologist. I will get the MRI ordered and see how quickly we can get this done.  Paresthesia  Paresthesia is an abnormal burning or prickling sensation. It is usually felt in the hands, arms, legs, or feet. However, it may occur in any part of the body. Usually, paresthesia is not painful. It may feel like: Tingling or numbness. Buzzing. Itching. Paresthesia may occur without any clear cause, or it may be caused by: Breathing too quickly (hyperventilation). Pressure on a nerve. An underlying medical condition. Side effects of a medicine. Nutritional deficiencies. Exposure to toxic chemicals. Most people experience temporary (transient) paresthesia at some time in their lives. For some people, it may be long-lasting (chronic) because of an underlying medical condition. If you have paresthesia that lasts a long time, you need to be evaluated by your health care provider. Follow these instructions at home: Nutrition Eat a healthy diet. This includes: Eating foods that are high in fiber, such as beans, whole grains, and fresh fruits and vegetables. Limiting foods that are high in fat and processed sugars, such as fried or sweet foods.  Alcohol use  Avoid or limit alcohol. Too much alcohol can cause a vitamin B deficiency, and vitamin B is needed for healthy nerves. Do not drink alcohol if: Your health care provider tells you not to drink. You are pregnant, may be pregnant, or are planning to become pregnant. If you drink alcohol: Limit how much you have to: 0-1 drink a day for women. 0-2 drinks a day for men. Know how much alcohol is in your drink. In the [place] one drink equals one 12 oz bottle of beer (355 mL), one 5 oz glass of wine (148 mL), or one 1 oz glass of hard liquor (44 mL). General instructions Take over-the-counter and prescription medicines only as told by your health care provider. Do not use any products that contain  nicotine or tobacco. These products include cigarettes, chewing tobacco, and vaping devices, such as e-cigarettes. If you need help quitting, ask your health care provider. If you have diabetes, work closely with your health care provider to keep your blood sugar under control. If you have numbness in your feet: Check every day for signs of injury or infection. Watch for redness, warmth, and swelling. Wear padded socks and comfortable shoes. These help protect your feet. Keep all follow-up visits. This is important. Contact a health care provider if you: Have paresthesia that gets worse or does not go away. Have numbness after an injury. Have a burning or prickling feeling that gets worse when you walk. Have pain, cramps, or dizziness, or you faint. Develop a rash. Get help right away if you: Feel muscle weakness. Develop new weakness in an arm or leg. Have trouble walking or moving. Have problems with speech, understanding, or vision. Feel confused. Cannot control your bladder or bowel movements. These symptoms may be an emergency. Get help right away. Call 911. Do not wait to see if the symptoms will go away. Do not drive yourself to the hospital. Summary Paresthesia is an abnormal burning or prickling sensation that is usually felt in the hands, arms, legs, or feet. It may also occur in other parts of the body. Paresthesia may occur without any clear cause, or it may be caused by breathing too quickly (hyperventilation), pressure on a nerve, an underlying medical condition, side effects of a medicine, nutritional deficiencies, or exposure to toxic chemicals. If you have  paresthesia that lasts a long time, you need to be evaluated by your health care provider. This information is not intended to replace advice given to you by your health care provider. Make sure you discuss any questions you have with your health care provider. Document Revised: 05/15/2021 Document Reviewed:  05/15/2021 Elsevier Patient Education  2024 [employer].

## 2024-01-21 NOTE — Telephone Encounter (Signed)
 Copied from CRM [id number]. Topic: Clinical - Red Word Triage >> Jan 21, 2024  9:20 AM Tiffany B wrote: Red Word that prompted transfer to Nurse Triage:Patient returning call regarding a sooner appointment. Patient declined to speak with NT and states she will wait until her appointment. This afternoon.
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
## 2024-01-22 ENCOUNTER — Telehealth: Payer: Self-pay

## 2024-01-22 ENCOUNTER — Encounter: Payer: Self-pay | Admitting: Nurse Practitioner

## 2024-01-22 LAB — COMPREHENSIVE METABOLIC PANEL WITH GFR
ALT: 12 IU/L (ref 0–32)
AST: 16 IU/L (ref 0–40)
Albumin: 4.4 g/dL (ref 3.9–4.9)
Alkaline Phosphatase: 49 IU/L (ref 44–121)
BUN/Creatinine Ratio: 12 (ref 9–23)
BUN: 10 mg/dL (ref 6–24)
Bilirubin Total: 0.5 mg/dL (ref 0.0–1.2)
CO2: 23 mmol/L (ref 20–29)
Calcium: 9.5 mg/dL (ref 8.7–10.2)
Chloride: 104 mmol/L (ref 96–106)
Creatinine, Ser: 0.84 mg/dL (ref 0.57–1.00)
Globulin, Total: 3.1 g/dL (ref 1.5–4.5)
Glucose: 81 mg/dL (ref 70–99)
Potassium: 4.6 mmol/L (ref 3.5–5.2)
Sodium: 139 mmol/L (ref 134–144)
Total Protein: 7.5 g/dL (ref 6.0–8.5)
eGFR: 86 mL/min/{1.73_m2} (ref >59)

## 2024-01-22 LAB — VITAMIN B12: Vitamin B-12: 674 pg/mL (ref 232–1245)

## 2024-01-22 LAB — CBC WITH DIFFERENTIAL/PLATELET
Basophils Absolute: 0 10*3/uL (ref 0.0–0.2)
Basos: 1 %
EOS (ABSOLUTE): 0 10*3/uL (ref 0.0–0.4)
Eos: 0 %
Hematocrit: 40.2 % (ref 34.0–46.6)
Hemoglobin: 13.5 g/dL (ref 11.1–15.9)
Immature Grans (Abs): 0 10*3/uL (ref 0.0–0.1)
Immature Granulocytes: 0 %
Lymphocytes Absolute: 1.6 10*3/uL (ref 0.7–3.1)
Lymphs: 30 %
MCH: 32 pg (ref 26.6–33.0)
MCHC: 33.6 g/dL (ref 31.5–35.7)
MCV: 95 fL (ref 79–97)
Monocytes Absolute: 0.3 10*3/uL (ref 0.1–0.9)
Monocytes: 5 %
Neutrophils Absolute: 3.4 10*3/uL (ref 1.4–7.0)
Neutrophils: 64 %
Platelets: 252 10*3/uL (ref 150–450)
RBC: 4.22 x10E6/uL (ref 3.77–5.28)
RDW: 13 % (ref 11.7–15.4)
WBC: 5.3 10*3/uL (ref 3.4–10.8)

## 2024-01-22 LAB — TSH: TSH: 0.589 u[IU]/mL (ref 0.450–4.500)

## 2024-01-22 LAB — IRON,TIBC AND FERRITIN PANEL
Ferritin: 122 ng/mL (ref 15–150)
Iron Saturation: 25 % (ref 15–55)
Iron: 76 ug/dL (ref 27–159)
Total Iron Binding Capacity: 304 ug/dL (ref 250–450)
UIBC: 228 ug/dL (ref 131–425)

## 2024-01-22 LAB — HEMOGLOBIN A1C
Est. average glucose Bld gHb Est-mCnc: 100 mg/dL
Hgb A1c MFr Bld: 5.1 % (ref 4.8–5.6)

## 2024-01-22 LAB — T4, FREE: Free T4: 1.12 ng/dL (ref 0.82–1.77)

## 2024-01-22 LAB — VITAMIN D 25 HYDROXY (VIT D DEFICIENCY, FRACTURES): Vit D, 25-Hydroxy: 24 ng/mL — ABNORMAL LOW (ref 30.0–100.0)

## 2024-01-22 NOTE — Assessment & Plan Note (Signed)
 Chronic paresthesia and dizziness, primarily on the right side, with increasing frequency since December. Symptoms include numbness, tingling, heaviness, and occasional burning sensation in the right arm and leg, as well as dizziness and facial pressure. Previous MRIs and neurological evaluations have not identified a clear cause. Differential diagnosis includes B12 deficiency, thyroid  dysfunction, anemia, and other neurological conditions. No previous B12 testing found. Neurological referral is planned for further evaluation. No EMG testing in the past known. History of anemia without recent evaluation considered a possible cause. Discussed that MRI is typically not a fast process in the outpatient setting, but we will work to get this ordered.  Exam is benign today with no asymmetry in strength or sensation,  - Order labs - Refer to neurology for further evaluation - Order MRI

## 2024-01-22 NOTE — Assessment & Plan Note (Addendum)
 Chronic paresthesia and dizziness, primarily on the right side, with increasing frequency since December. Symptoms include numbness, tingling, heaviness, and occasional burning sensation in the right arm and leg, as well as dizziness and facial pressure. Previous MRIs and neurological evaluations have not identified a clear cause. Differential diagnosis includes B12 deficiency, thyroid  dysfunction, anemia, and other neurological conditions. No previous B12 testing found. Neurological referral is planned for further evaluation. No EMG testing in the past known. History of anemia without recent evaluation considered a possible cause. Discussed that MRI is typically not a fast process in the outpatient setting, but we will work to get this ordered.  Exam is benign today with no asymmetry in strength or sensation,  - Order labs - Refer to neurology for further evaluation - Order MRI

## 2024-01-22 NOTE — Telephone Encounter (Unsigned)
 Copied from CRM [id number]. Topic: Clinical - Lab/Test Results >> Jan 22, 2024  4:41 PM Chuck Crater wrote: Reason for CRM: Patient is requesting a call back to discuss results.

## 2024-01-23 ENCOUNTER — Telehealth: Payer: Self-pay

## 2024-01-23 ENCOUNTER — Other Ambulatory Visit: Payer: Self-pay

## 2024-01-23 MED ORDER — VITAMIN D (ERGOCALCIFEROL) 1.25 MG (50000 UNIT) PO CAPS: 50000.0000 [IU] | ORAL_CAPSULE | ORAL | 1 refills | Status: AC

## 2024-01-23 NOTE — Telephone Encounter (Signed)
 Pt. Asking about labs I have sent her a MyChart message stating they have not been reviewed yet and once they are she will be able to see everything on MyChart.   Copied from CRM [id number]. Topic: Clinical - Lab/Test Results >> Jan 22, 2024  4:41 PM Chuck Crater wrote: Reason for CRM: Patient is requesting a call back to discuss results. >> Jan 23, 2024  8:20 AM Allyne Areola wrote: Patient is calling because she is only able to see lab results for Glucose and not the complete labs she had done on 01/21/2024. She would like a call back to discuss labs and see if they can also be released on MyChart.

## 2024-01-23 NOTE — Telephone Encounter (Signed)
 All labs should be available to be seen. They automatically release as soon as they results from [facility]. I am not sure why she can't see them. We can mail her a copy.  Please call and get her questions and let her know that I will be happy to send her answers through MyChart.

## 2024-01-23 NOTE — Telephone Encounter (Signed)
"   Pt. Can now see all of here labs. She wanted to know about her Vit D being low. I told her you would recommend a prescription Vit D or a daily OTC Vit D but I would send her a MyChart message and let her know. She wondered if any of her symptoms was coming from her low Vit D.   I do not expect that her level of vitamin D would cause any significant symptoms, but I cannot definitively say yes or no. I recommend starting the high dose vitamin D to see if she notices any improvement over the next few weeks.

## 2024-01-24 ENCOUNTER — Ambulatory Visit: Admitting: Nurse Practitioner

## 2024-01-27 ENCOUNTER — Encounter: Payer: Self-pay | Admitting: Diagnostic Neuroimaging

## 2024-01-27 ENCOUNTER — Ambulatory Visit (INDEPENDENT_AMBULATORY_CARE_PROVIDER_SITE_OTHER): Admitting: Diagnostic Neuroimaging

## 2024-01-27 VITALS — BP 126/84 | HR 74 | Ht 68.5 in | Wt 154.0 lb

## 2024-01-27 DIAGNOSIS — H9311 Tinnitus, right ear: Secondary | ICD-10-CM | POA: Diagnosis not present

## 2024-01-27 DIAGNOSIS — R42 Dizziness and giddiness: Secondary | ICD-10-CM

## 2024-01-27 DIAGNOSIS — R200 Anesthesia of skin: Secondary | ICD-10-CM | POA: Diagnosis not present

## 2024-01-27 NOTE — Progress Notes (Signed)
 [facility]  PATIENT: Haley Welch DOB: [birth year]  REFERRING CLINICIAN: Early, Adriane Albe, NP HISTORY FROM: patient  REASON FOR VISIT: new consult   HISTORICAL  CHIEF COMPLAINT:  Chief Complaint  Patient presents with   New Patient (Initial Visit)    RM 6, Pt alone. Referred by PCP for vertigo, numbness and tingling on right side. States has had numbness and tingling for years with MRI and CT being inconclusive. First of the year this has become more consistent and vertigo has stared. Has started Vit D therapy due to deficiency.     HISTORY OF PRESENT ILLNESS:   48 year old female here for evaluation of numbness and tingling.  Symptoms started around 2005.  Patient has been having intermittent numbness from her right elbow to her right hand and right knee down to her right foot.  Arm and leg symptoms happen simultaneously.  Very rarely has the left side.  Sometimes has noticed tingling on her right face.  Denies any headaches.  Separately may have some intermittent rocking on the boat sensation.  Has developed some right-sided tinnitus.  Has been to neurologist in the past.  In 2021 had MRI of the brain which was unremarkable.   REVIEW OF SYSTEMS: Full 14 system review of systems performed and negative with exception of: as per HPI.  ALLERGIES: No Known Allergies  HOME MEDICATIONS: Outpatient Medications Prior to Visit  Medication Sig Dispense Refill   Multiple Vitamin (MULTIVITAMIN) tablet Take 1 tablet by mouth daily.     Vitamin D , Ergocalciferol , (DRISDOL ) 1.25 MG (50000 UNIT) CAPS capsule Take 1 capsule (50,000 Units total) by mouth every 7 (seven) days. 12 capsule 1   No facility-administered medications prior to visit.    PAST MEDICAL HISTORY: Past Medical History:  Diagnosis Date   Acute chest wall pain 06/17/2023   Anemia    COVID-19 06/16/2022   Mitral valve prolapse    Palpitations    Thyroid  nodule 01/04/2022    PAST SURGICAL  HISTORY: Past Surgical History:  Procedure Laterality Date   LAPAROSCOPIC HYSTERECTOMY  2021    FAMILY HISTORY: Family History  Problem Relation Age of Onset   Colon cancer Neg Hx    Esophageal cancer Neg Hx    Rectal cancer Neg Hx    Stomach cancer Neg Hx    Uterine cancer Neg Hx    Bladder Cancer Neg Hx    Renal cancer Neg Hx     SOCIAL HISTORY: Social History   Socioeconomic History   Marital status: Married    Spouse name: Not on file   Number of children: Not on file   Years of education: Not on file   Highest education level: Master's degree (e.g., MA, MS, MEng, MEd, MSW, MBA)  Occupational History   Not on file  Tobacco Use   Smoking status: Never   Smokeless tobacco: Never  Vaping Use   Vaping status: Never Used  Substance and Sexual Activity   Alcohol use: Not Currently   Drug use: Never   Sexual activity: Yes    Partners: Male    Birth control/protection: Surgical  Other Topics Concern   Not on file  Social History Narrative   Not on file   Social Drivers of Health   Financial Resource Strain: Low Risk  (01/21/2024)   Overall Financial Resource Strain (CARDIA)    Difficulty of Paying Living Expenses: Not hard at all  Food Insecurity: No Food Insecurity (01/21/2024)   Hunger  Vital Sign    Worried About Programme researcher, broadcasting/film/video in the Last Year: Never true    Ran Out of Food in the Last Year: Never true  Transportation Needs: No Transportation Needs (01/21/2024)   PRAPARE - Administrator, Civil Service (Medical): No    Lack of Transportation (Non-Medical): No  Physical Activity: Insufficiently Active (01/21/2024)   Exercise Vital Sign    Days of Exercise per Week: 1 day    Minutes of Exercise per Session: 30 min  Stress: No Stress Concern Present (01/21/2024)   [employer] of Occupational Health - Occupational Stress Questionnaire    Feeling of Stress : Only a little  Social Connections: Socially Integrated (01/21/2024)   Social Connection  and Isolation Panel [NHANES]    Frequency of Communication with Friends and Family: More than three times a week    Frequency of Social Gatherings with Friends and Family: Once a week    Attends Religious Services: 1 to 4 times per year    Active Member of [employer] or Organizations: Yes    Attends Banker Meetings: 1 to 4 times per year    Marital Status: Married  Catering manager Violence: Not on file     PHYSICAL EXAM  GENERAL EXAM/CONSTITUTIONAL: Vitals:  Vitals:   01/27/24 1027  BP: 126/84  Pulse: 74  Weight: 154 lb (69.9 kg)  Height: 5' 8.5" (1.74 m)   Body mass index is 23.08 kg/m. Wt Readings from Last 3 Encounters:  01/27/24 154 lb (69.9 kg)  01/21/24 153 lb 12.8 oz (69.8 kg)  01/01/24 152 lb 12.8 oz (69.3 kg)   Patient is in no distress; well developed, nourished and groomed; neck is supple  CARDIOVASCULAR: Examination of carotid arteries is normal; no carotid bruits Regular rate and rhythm, no murmurs Examination of peripheral vascular system by observation and palpation is normal  EYES: Ophthalmoscopic exam of optic discs and posterior segments is normal; no papilledema or hemorrhages No results found.  MUSCULOSKELETAL: Gait, strength, tone, movements noted in Neurologic exam below  NEUROLOGIC: MENTAL STATUS:      No data to display         awake, alert, oriented to person, place and time recent and remote memory intact normal attention and concentration language fluent, comprehension intact, naming intact fund of knowledge appropriate  CRANIAL NERVE:  2nd - no papilledema on fundoscopic exam 2nd, 3rd, 4th, 6th - pupils equal and reactive to light, visual fields full to confrontation, extraocular muscles intact, no nystagmus 5th - facial sensation symmetric 7th - facial strength symmetric 8th - hearing intact 9th - palate elevates symmetrically, uvula midline 11th - shoulder shrug symmetric 12th - tongue protrusion  midline  MOTOR:  normal bulk and tone, full strength in the BUE, BLE  SENSORY:  normal and symmetric to light touch, temperature, vibration  COORDINATION:  finger-nose-finger, fine finger movements normal  REFLEXES:  deep tendon reflexes TRACE and symmetric  GAIT/STATION:  narrow based gait     DIAGNOSTIC DATA (LABS, IMAGING, TESTING) - I reviewed patient records, labs, notes, testing and imaging myself where available.  Lab Results  Component Value Date   WBC 5.3 01/21/2024   HGB 13.5 01/21/2024   HCT 40.2 01/21/2024   MCV 95 01/21/2024   PLT 252 01/21/2024      Component Value Date/Time   NA 139 01/21/2024 1203   K 4.6 01/21/2024 1203   CL 104 01/21/2024 1203   CO2 23 01/21/2024 1203  GLUCOSE 81 01/21/2024 1203   GLUCOSE 118 (H) 08/21/2021 1226   BUN 10 01/21/2024 1203   CREATININE 0.84 01/21/2024 1203   CALCIUM 9.5 01/21/2024 1203   PROT 7.5 01/21/2024 1203   ALBUMIN 4.4 01/21/2024 1203   AST 16 01/21/2024 1203   ALT 12 01/21/2024 1203   ALKPHOS 49 01/21/2024 1203   BILITOT 0.5 01/21/2024 1203   GFRNONAA >60 08/21/2021 1226   No results found for: "CHOL", "HDL", "LDLCALC", "LDLDIRECT", "TRIG", "CHOLHDL" Lab Results  Component Value Date   HGBA1C 5.1 01/21/2024   Lab Results  Component Value Date   VITAMINB12 674 01/21/2024   Lab Results  Component Value Date   TSH 0.589 01/21/2024   Component Ref Range & Units 1 yr ago  Vitamin D  Total, 25OH 30 - 100 ng/ml 15 Low     12/31/19 MRI brain  No acute intracranial abnormality.   12/31/19 MRI venogram - No venous thrombus.    ASSESSMENT AND PLAN  48 y.o. year old female here with:  Dx:  1. Numbness   2. Vertigo   3. Right-sided tinnitus     PLAN:  RIGHT SIDED NUMBNESS, VERTIGO, TINNITUS (intermittent symptoms since ~2005) - check MRI brain, cervical spine (rule out autoimmune, inflamm, demyelinating) - avoid compression at elbow and knee - monitor symptoms; if worsening or more  persistent, then   Orders Placed This Encounter  Procedures   MR BRAIN W WO CONTRAST   MR CERVICAL SPINE W WO CONTRAST   Return for pending if symptoms worsen or fail to improve, pending test results.    Omega Bible, MD 01/27/2024, 11:07 AM Certified in Neurology, Neurophysiology and Neuroimaging  [facility] [address] [place], [place] [address] [phone]

## 2024-02-03 ENCOUNTER — Encounter: Payer: Self-pay | Admitting: Diagnostic Neuroimaging

## 2024-02-06 ENCOUNTER — Encounter: Payer: Self-pay | Admitting: Diagnostic Neuroimaging

## 2024-02-13 ENCOUNTER — Ambulatory Visit
Admission: RE | Admit: 2024-02-13 | Discharge: 2024-02-13 | Disposition: A | Discharge status: Home / Self Care | Source: Ambulatory Visit | Attending: Diagnostic Neuroimaging | Admitting: Diagnostic Neuroimaging

## 2024-02-13 DIAGNOSIS — R200 Anesthesia of skin: Secondary | ICD-10-CM

## 2024-02-13 MED ORDER — GADOPICLENOL 0.5 MMOL/ML IV SOLN
7.0000 mL | Freq: Once | INTRAVENOUS | Status: AC | PRN
Start: 2024-02-13 — End: 2024-02-13
  Administered 2024-02-13: 7 mL via INTRAVENOUS

## 2024-02-19 ENCOUNTER — Telehealth: Payer: Self-pay | Admitting: Diagnostic Neuroimaging

## 2024-02-19 ENCOUNTER — Ambulatory Visit: Payer: Self-pay | Admitting: Diagnostic Neuroimaging

## 2024-02-19 NOTE — Telephone Encounter (Signed)
 Patient LVM at 4:05 pm 02/18/24: call regarding some MRI I had conducted at [facility]. If someone could give me a call back.

## 2024-02-19 NOTE — Telephone Encounter (Signed)
 Cld Pt. No answer, LVM (per DPR) to make Pt aware Dr. Salli Crawley has not reviewed he MRI results, but once he does and sends results we will reach out to her or provider may reach out to her himself with results.

## 2024-03-05 ENCOUNTER — Encounter: Payer: Self-pay | Admitting: Podiatry

## 2024-03-05 ENCOUNTER — Ambulatory Visit (INDEPENDENT_AMBULATORY_CARE_PROVIDER_SITE_OTHER)

## 2024-03-05 ENCOUNTER — Ambulatory Visit: Admitting: Podiatry

## 2024-03-05 DIAGNOSIS — M205X1 Other deformities of toe(s) (acquired), right foot: Secondary | ICD-10-CM

## 2024-03-05 DIAGNOSIS — M7751 Other enthesopathy of right foot: Secondary | ICD-10-CM | POA: Diagnosis not present

## 2024-03-05 MED ORDER — MELOXICAM 15 MG PO TABS: 15.0000 mg | ORAL_TABLET | Freq: Every day | ORAL | 0 refills | Status: DC

## 2024-03-05 MED ORDER — TRIAMCINOLONE ACETONIDE 10 MG/ML IJ SUSP
10.0000 mg | Freq: Once | INTRAMUSCULAR | Status: AC
Start: 2024-03-05 — End: 2024-03-05
  Administered 2024-03-05: 10 mg

## 2024-03-05 NOTE — Patient Instructions (Signed)
Hallux Limitus Hallux limitus is a condition involving pain and a loss of motion of the first (big) toe. The pain gets worse with lifting up (extension) of the toe. This is usually due to arthritic bony bumps (spurring) of the joint at the base of the big toe.  SYMPTOMS   Pain, with lifting up of the toe.  Tenderness over the joint where the big toe meets the foot.  Redness, swelling, and warmth over the top of the base of the big toe (sometimes).  Foot pain, stiffness, and limping. CAUSES  Hallux limitus is caused by arthritis of the joint where the big toe meets the foot. The arthritis creates a bone spur that pinches the soft tissues when the toe is extended. RISK INCREASES WITH:  Tight shoes with a narrow toe box.  Family history of foot problems.  Gout and rheumatoid and psoriatic arthritis.  History of previous toe injury, including "turf toe."  Long first toe, flat feet, and other big toe bony bumps.  Arthritis of the big toe. PREVENTION   Wear wide-toed shoes that fit well.  Tape the big toe to reduce motion and to prevent pinching of the tissues between the bone.  Maintain physical fitness:  Foot and ankle flexibility.  Muscle strength and endurance. PROGNOSIS  This condition can usually be managed with proper treatment. However, surgery is typically required to prevent the problem from recurring.  RELATED COMPLICATIONS  Injury to other areas of the foot or ankle, caused by abnormal walking in an attempt to avoid the pain felt when walking normally. TREATMENT Treatment first involves stopping the activities that aggravate your symptoms. Ice and medicine can be used to reduce the pain and inflammation. Modifications to shoes may help reduce pain, including wearing stiff-soled shoes, shoes with a wide toe box, inserting a padded donut to relieve pressure on top of the joint, or wearing an arch support. Corticosteroid injections may be given to reduce inflammation. If  nonsurgical treatment is unsuccessful, surgery may be needed. Surgical options include removing the arthritic bony spur, cutting a bone in the foot to change the arc of motion (allowing the toe to extend more), or fusion of the joint (eliminating all motion in the joint at the base of the big toe).  MEDICATION   If pain medicine is needed, nonsteroidal anti-inflammatory medicines (aspirin and ibuprofen), or other minor pain relievers (acetaminophen), are often advised.  Do not take pain medicine for 7 days before surgery.  Prescription pain relievers are usually prescribed only after surgery. Use only as directed and only as much as you need.  Ointments for arthritis, applied to the skin, may give some relief.  Injections of corticosteroids may be given to reduce inflammation. HEAT AND COLD  Cold treatment (icing) relieves pain and reduces inflammation. Cold treatment should be applied for 10 to 15 minutes every 2 to 3 hours, and immediately after activity that aggravates your symptoms. Use ice packs or an ice massage.  Heat treatment may be used before performing the stretching and strengthening activities prescribed by your caregiver, physical therapist, or athletic trainer. Use a heat pack or a warm water soak. SEEK MEDICAL CARE IF:   Symptoms get worse or do not improve in 2 weeks, despite treatment.  After surgery you develop fever, increasing pain, redness, swelling, drainage of fluids, bleeding, or increasing warmth.  New, unexplained symptoms develop.    

## 2024-03-05 NOTE — Progress Notes (Unsigned)
 Hallux limitus right Dorsal bone spur with some elevatus  Would potentially go for surgery over the holidays.  Did discuss sounds like versus cheilectomy.  Discussed stiff soled shoe

## 2024-04-02 ENCOUNTER — Ambulatory Visit: Admitting: Podiatry

## 2024-04-03 ENCOUNTER — Other Ambulatory Visit: Payer: Self-pay | Admitting: Podiatry

## 2024-04-10 ENCOUNTER — Ambulatory Visit: Admitting: Podiatry

## 2024-04-16 ENCOUNTER — Encounter: Payer: Self-pay | Admitting: Podiatry

## 2024-04-16 ENCOUNTER — Ambulatory Visit (INDEPENDENT_AMBULATORY_CARE_PROVIDER_SITE_OTHER): Admitting: Podiatry

## 2024-04-16 DIAGNOSIS — M7751 Other enthesopathy of right foot: Secondary | ICD-10-CM

## 2024-04-16 DIAGNOSIS — M205X1 Other deformities of toe(s) (acquired), right foot: Secondary | ICD-10-CM

## 2024-04-16 MED ORDER — TRIAMCINOLONE ACETONIDE 10 MG/ML IJ SUSP
10.0000 mg | Freq: Once | INTRAMUSCULAR | Status: AC
  Administered 2024-04-16: 10 mg

## 2024-04-16 NOTE — Progress Notes (Signed)
 Chief Complaint  Patient presents with   Toe Pain   Bunions    Follow up for pt R big toe.  She says the injection did help a lot  but is starting to become sore again.    HPI: 48 y.o. female presents today following up for right foot hallux limitus.  She states that the injection was helpful for a while.  Is been starting to wear off over the past week or 2.  She states that she is moving to [place]  soon in the next week or so and is concerned about ongoing pain if the injection continues to wear off.  Pain is starting to limit her activity again, reports it is about 5 or 6 out of 10.  Does present wearing flat shoes.  Past Medical History:  Diagnosis Date   Acute chest wall pain 06/17/2023   Anemia    COVID-19 06/16/2022   Mitral valve prolapse    Palpitations    Thyroid  nodule 01/04/2022    Past Surgical History:  Procedure Laterality Date   LAPAROSCOPIC HYSTERECTOMY  2021    No Known Allergies  ROS    PHYSICAL EXAM:  General: The patient is alert and oriented x3 in no acute distress.  Dermatology: Skin is warm, dry and supple bilateral lower extremities. Interspaces are clear of maceration and debris.  No rashes noted.   Vascular: Palpable pedal pulses bilaterally. Capillary refill within normal limits.  No appreciable edema.  No erythema or calor.  Neurological: Light touch sensation grossly intact bilateral feet.   Musculoskeletal Exam: Right first MPJ there is osseous prominence dorsal first metatarsal head at the MPJ level.  There is a mild bunion clinically with prominent dorsal medial eminence.  Hallux limitus noted with decreased right first MPJ dorsiflexion about 30 degrees.  Minimal HAV drift of first toe.  There is tenderness on palpation of the dorsal first metatarsal head and with right first MPJ dorsiflexion.  Some localized inflammatory capsulitis about the right first MPJ noted.  Slight pes planus foot type.  RADIOGRAPHIC EXAM: Right foot 3  views weightbearing 03/05/2024 No acute fractures.  Joint spaces preserved.  There is dorsal flag sign to the dorsal first metatarsal head seen on lateral and oblique view.  Very mild sesamoid rotation.  Slight pes planus foot type.  Elevated first metatarsal noted.  ASSESSMENT/PLAN OF CARE: 1. Capsulitis of metatarsophalangeal (MTP) joint of right foot   2. Acquired hallux limitus of right foot      Meds ordered this encounter  Medications   triamcinolone  acetonide (KENALOG ) 10 MG/ML injection 10 mg   None  Verbal consent obtained to administer corticosteroid injection to the right first MPJ today.  Intra-articular injection administered following Betadine skin prep.  Injected 0.5 cc of 0.5% Marcaine plain mixed with 10 mg Kenalog .  Band-Aid applied.  Patient tolerated well.  Instructed patient to rest and limit excessive activity over the next couple days.  Recommend use of good supportive stiff soled sneaker.  May have some benefit from dancers pads to replicate a Morton cut out.  Dancer pad applied today.  She is moving away, did recommend that she get established with a podiatrist or foot and ankle surgeon in [place] , do think she would benefit from surgical consultation for cheilectomy versus first MPJ decompressive osteotomy, favored joint salvaging procedures.  She thinks that she will likely try and get this done closer to the end of the year.   Return if  symptoms worsen or fail to improve, for Hallux limitus.    Tameeka Luo L. Lamount MAUL, AACFAS [facility]     [address] [place], [place] [address]                Office [phone]  Fax [phone]
